# Patient Record
Sex: Female | Born: 1965 | Race: White | Hispanic: No | Marital: Married | Smoking: Former smoker
Health system: Southern US, Community
[De-identification: ages and names within clinical notes are randomized; demographics above are authoritative.]

## PROBLEM LIST (undated history)

## (undated) DIAGNOSIS — H409 Unspecified glaucoma: Secondary | ICD-10-CM

## (undated) DIAGNOSIS — F419 Anxiety disorder, unspecified: Secondary | ICD-10-CM

## (undated) DIAGNOSIS — M1990 Unspecified osteoarthritis, unspecified site: Secondary | ICD-10-CM

## (undated) DIAGNOSIS — E785 Hyperlipidemia, unspecified: Secondary | ICD-10-CM

## (undated) DIAGNOSIS — J302 Other seasonal allergic rhinitis: Secondary | ICD-10-CM

## (undated) DIAGNOSIS — Z87442 Personal history of urinary calculi: Secondary | ICD-10-CM

## (undated) DIAGNOSIS — E559 Vitamin D deficiency, unspecified: Secondary | ICD-10-CM

## (undated) DIAGNOSIS — E039 Hypothyroidism, unspecified: Secondary | ICD-10-CM

## (undated) HISTORY — DX: Vitamin D deficiency, unspecified: E55.9

## (undated) HISTORY — PX: TUBAL LIGATION: SHX77

## (undated) HISTORY — DX: Hyperlipidemia, unspecified: E78.5

## (undated) HISTORY — PX: ABDOMINAL HYSTERECTOMY: SHX81

## (undated) HISTORY — DX: Unspecified osteoarthritis, unspecified site: M19.90

## (undated) HISTORY — DX: Anxiety disorder, unspecified: F41.9

## (undated) HISTORY — DX: Other seasonal allergic rhinitis: J30.2

## (undated) HISTORY — DX: Morbid (severe) obesity due to excess calories: E66.01

## (undated) HISTORY — PX: CYSTOSCOPY: SUR368

---

## 2004-05-21 ENCOUNTER — Ambulatory Visit (HOSPITAL_COMMUNITY): Admission: RE | Admit: 2004-05-21 | Discharge: 2004-05-21 | Payer: Self-pay | Admitting: Family Medicine

## 2004-06-04 ENCOUNTER — Ambulatory Visit (HOSPITAL_COMMUNITY): Admission: RE | Admit: 2004-06-04 | Discharge: 2004-06-04 | Payer: Self-pay | Admitting: Family Medicine

## 2004-06-14 ENCOUNTER — Ambulatory Visit (HOSPITAL_COMMUNITY): Admission: RE | Admit: 2004-06-14 | Discharge: 2004-06-14 | Payer: Self-pay | Admitting: Urology

## 2004-06-17 ENCOUNTER — Ambulatory Visit (HOSPITAL_COMMUNITY): Admission: RE | Admit: 2004-06-17 | Discharge: 2004-06-17 | Payer: Self-pay | Admitting: Urology

## 2010-04-04 ENCOUNTER — Encounter: Payer: Self-pay | Admitting: Urology

## 2013-05-02 ENCOUNTER — Other Ambulatory Visit (HOSPITAL_COMMUNITY): Payer: Self-pay | Admitting: Family Medicine

## 2013-05-02 ENCOUNTER — Ambulatory Visit (HOSPITAL_COMMUNITY)
Admission: RE | Admit: 2013-05-02 | Discharge: 2013-05-02 | Disposition: A | Discharge status: Home / Self Care | Payer: BC Managed Care – PPO | Source: Ambulatory Visit | Attending: Family Medicine | Admitting: Family Medicine

## 2013-05-02 DIAGNOSIS — R109 Unspecified abdominal pain: Secondary | ICD-10-CM

## 2013-05-02 DIAGNOSIS — K8020 Calculus of gallbladder without cholecystitis without obstruction: Secondary | ICD-10-CM | POA: Insufficient documentation

## 2013-05-02 DIAGNOSIS — K7689 Other specified diseases of liver: Secondary | ICD-10-CM | POA: Insufficient documentation

## 2013-05-02 DIAGNOSIS — R1011 Right upper quadrant pain: Secondary | ICD-10-CM

## 2013-05-03 ENCOUNTER — Inpatient Hospital Stay (HOSPITAL_COMMUNITY): Admission: RE | Admit: 2013-05-03 | Payer: Self-pay | Source: Ambulatory Visit

## 2013-05-07 ENCOUNTER — Encounter (HOSPITAL_COMMUNITY): Payer: Self-pay | Admitting: Pharmacy Technician

## 2013-05-07 ENCOUNTER — Encounter (HOSPITAL_COMMUNITY): Payer: Self-pay

## 2013-05-07 ENCOUNTER — Encounter (HOSPITAL_COMMUNITY)
Admission: RE | Admit: 2013-05-07 | Discharge: 2013-05-07 | Disposition: A | Discharge status: Home / Self Care | Payer: BC Managed Care – PPO | Source: Ambulatory Visit | Attending: General Surgery | Admitting: General Surgery

## 2013-05-07 HISTORY — DX: Unspecified glaucoma: H40.9

## 2013-05-07 HISTORY — DX: Personal history of urinary calculi: Z87.442

## 2013-05-07 LAB — BASIC METABOLIC PANEL
BUN: 14 mg/dL (ref 6–23)
CALCIUM: 9.1 mg/dL (ref 8.4–10.5)
CO2: 28 mEq/L (ref 19–32)
CREATININE: 0.65 mg/dL (ref 0.50–1.10)
Chloride: 101 mEq/L (ref 96–112)
GFR calc Af Amer: >90 mL/min (ref >90)
GLUCOSE: 104 mg/dL — AB (ref 70–99)
Potassium: 4.4 mEq/L (ref 3.7–5.3)
Sodium: 141 mEq/L (ref 137–147)

## 2013-05-07 LAB — HEPATIC FUNCTION PANEL
ALK PHOS: 143 U/L — AB (ref 39–117)
ALT: 194 U/L — AB (ref 0–35)
AST: 147 U/L — ABNORMAL HIGH (ref 0–37)
Albumin: 3.3 g/dL — ABNORMAL LOW (ref 3.5–5.2)
BILIRUBIN DIRECT: 0.2 mg/dL (ref 0.0–0.3)
BILIRUBIN TOTAL: 0.5 mg/dL (ref 0.3–1.2)
Indirect Bilirubin: 0.3 mg/dL (ref 0.3–0.9)
Total Protein: 7.8 g/dL (ref 6.0–8.3)

## 2013-05-07 LAB — CBC WITH DIFFERENTIAL/PLATELET
BASOS ABS: 0 10*3/uL (ref 0.0–0.1)
BASOS PCT: 0 % (ref 0–1)
EOS ABS: 0.1 10*3/uL (ref 0.0–0.7)
Eosinophils Relative: 2 % (ref 0–5)
HEMATOCRIT: 39.9 % (ref 36.0–46.0)
HEMOGLOBIN: 13.2 g/dL (ref 12.0–15.0)
Lymphocytes Relative: 18 % (ref 12–46)
Lymphs Abs: 1 10*3/uL (ref 0.7–4.0)
MCH: 30.6 pg (ref 26.0–34.0)
MCHC: 33.1 g/dL (ref 30.0–36.0)
MCV: 92.4 fL (ref 78.0–100.0)
MONO ABS: 0.4 10*3/uL (ref 0.1–1.0)
Monocytes Relative: 8 % (ref 3–12)
NEUTROS ABS: 3.9 10*3/uL (ref 1.7–7.7)
NEUTROS PCT: 72 % (ref 43–77)
Platelets: 337 10*3/uL (ref 150–400)
RBC: 4.32 MIL/uL (ref 3.87–5.11)
RDW: 14.5 % (ref 11.5–15.5)
WBC: 5.5 10*3/uL (ref 4.0–10.5)

## 2013-05-07 NOTE — Patient Instructions (Signed)
Molly DibbleMona B Meinhardt  05/07/2013   Your procedure is scheduled on:   05/10/2013  Report to [facility] at  730  AM.  Call this number if you have problems the morning of surgery: [phone]   Remember:   Do not eat food or drink liquids after midnight.   Take these medicines the morning of surgery with A SIP OF WATER: oxycodone   Do not wear jewelry, make-up or nail polish.  Do not wear lotions, powders, or perfumes.   Do not shave 48 hours prior to surgery. Men may shave face and neck.  Do not bring valuables to the hospital.  [facility] not [facility] any belongings or valuables.               Contacts, dentures or bridgework may not be worn into surgery.  Leave suitcase in the car. After surgery it may be brought to your room.  For patients admitted to the hospital, discharge time is determined by your treatment team.               Patients discharged the day of surgery will not be allowed to drive home.  Name and phone number of your driver: family  Special Instructions: Shower using CHG 2 nights before surgery and the night before surgery.  If you shower the day of surgery use CHG.  Use special wash - you have one bottle of CHG for all showers.  You should use approximately 1/3 of the bottle for each shower.   Please read over the following fact sheets that you were given: Pain Booklet, Coughing and Deep Breathing, Surgical Site Infection Prevention, Anesthesia Post-op Instructions and Care and Recovery After Surgery Laparoscopic Cholecystectomy Laparoscopic cholecystectomy is surgery to remove the gallbladder. The gallbladder is located in the upper right part of the abdomen, behind the liver. It is a storage sac for bile produced in the liver. Bile aids in the digestion and absorption of fats. Cholecystectomy is often done for inflammation of the gallbladder (cholecystitis). This condition is usually caused by a buildup of gallstones (cholelithiasis) in your gallbladder.  Gallstones can block the flow of bile, resulting in inflammation and pain. In severe cases, emergency surgery may be required. When emergency surgery is not required, you will have time to prepare for the procedure. Laparoscopic surgery is an alternative to open surgery. Laparoscopic surgery has a shorter recovery time. Your common bile duct may also need to be examined during the procedure. If stones are found in the common bile duct, they may be removed. LET [facility] KNOW ABOUT:  Any allergies you have.  All medicines you are taking, including vitamins, herbs, eye drops, creams, and over-the-counter medicines.  Previous problems you or members of your family have had with the use of anesthetics.  Any blood disorders you have.  Previous surgeries you have had.  Medical conditions you have. RISKS AND COMPLICATIONS Generally, this is a safe procedure. However, as with any procedure, complications can occur. Possible complications include:  Infection.  Damage to the common bile duct, nerves, arteries, veins, or other internal organs such as the stomach, liver, or intestines.  Bleeding.  A stone may remain in the common bile duct.  A bile leak from the cyst duct that is clipped when your gallbladder is removed.  The need to convert to open surgery, which requires a larger incision in the abdomen. This may be necessary if your surgeon thinks it is  not safe to continue with a laparoscopic procedure. BEFORE THE PROCEDURE  Ask your health care provider about changing or stopping any regular medicines. You will need to stop taking aspirin or blood thinners at least 5 days prior to surgery.  Do not eat or drink anything after midnight the night before surgery.  Let your health care provider know if you develop a cold or other infectious problem before surgery. PROCEDURE   You will be given medicine to make you sleep through the procedure (general anesthetic). A breathing  tube will be placed in your mouth.  When you are asleep, your surgeon will make several small cuts (incisions) in your abdomen.  A thin, lighted tube with a tiny camera on the end (laparoscope) is inserted through one of the small incisions. The camera on the laparoscope sends a picture to a TV screen in the operating room. This gives the surgeon a good view inside your abdomen.  A gas will be pumped into your abdomen. This expands your abdomen so that the surgeon has more room to perform the surgery.  Other tools needed for the procedure are inserted through the other incisions. The gallbladder is removed through one of the incisions.  After the removal of your gallbladder, the incisions will be closed with stitches, staples, or skin glue. AFTER THE PROCEDURE  You will be taken to a recovery area where your progress will be checked often.  You may be allowed to go home the same day if your pain is controlled and you can tolerate liquids. Document Released: 02/28/2005 Document Revised: 12/19/2012 Document Reviewed: 10/10/2012 [facility] Patient Information 2014 [place]. PATIENT INSTRUCTIONS POST-ANESTHESIA  IMMEDIATELY FOLLOWING SURGERY:  Do not drive or operate machinery for the first twenty four hours after surgery.  Do not make any important decisions for twenty four hours after surgery or while taking narcotic pain medications or sedatives.  If you develop intractable nausea and vomiting or a severe headache please notify your doctor immediately.  FOLLOW-UP:  Please make an appointment with your surgeon as instructed. You do not need to follow up with anesthesia unless specifically instructed to do so.  WOUND CARE INSTRUCTIONS (if applicable):  Keep a dry clean dressing on the anesthesia/puncture wound site if there is drainage.  Once the wound has quit draining you may leave it open to air.  Generally you should leave the bandage intact for twenty four hours unless there is  drainage.  If the epidural site drains for more than 36-48 hours please call the anesthesia department.  QUESTIONS?:  Please feel free to call your physician or the hospital operator if you have any questions, and they will be happy to assist you.

## 2013-05-07 NOTE — H&P (Signed)
  NTS SOAP Note  Vital Signs:  Vitals as of: 05/07/2013: Systolic 137: Diastolic 90: Heart Rate 98: Temp 96.91F: Height 255ft 4in: Weight 275Lbs 0 Ounces: BMI 47.2  BMI : 47.2 kg/m2  Subjective: This 48 Years 559 Months old Female presents for of abdominal pain.  Has been having intermittent right upper quadrant abdominal pain, radiating to the right flank, nausea, and fatty food intolerance.  No fever, chills, jaundice.  U/S of gallbladder reveals choleltihaisis, normal common bile duct.  Review of Symptoms:  Constitutional:unremarkable   Head:unremarkable    Eyes:unremarkable   Nose/Mouth/Throat:unremarkable Cardiovascular:  unremarkable   Respiratory:unremarkable       see above Genitourinary:unremarkable     Musculoskeletal:unremarkable   Skin:unremarkable Breast:   Hematolgic/Lymphatic:unremarkable     Allergic/Immunologic:unremarkable     Past Medical History:    Reviewed  Past Medical History  Surgical History: SURGICAL HISTORY... Hysterectomy Medical Problems: glaucoma Allergies: sulfur, codeine (tolerates percocet) Medications: lumigan   Social History:Reviewed  Social History  Preferred Language: English Race:  White Ethnicity: Not Hispanic / Latino Age: 48 Years 9 Months Marital Status:  M Alcohol: no Recreational drug(s): no   Smoking Status: Never smoker reviewed on 05/07/2013 Functional Status reviewed on 05/07/2013 ------------------------------------------------ Bathing: Normal Cooking: Normal Dressing: Normal Driving: Normal Eating: Normal Managing Meds: Normal Oral Care: Normal Shopping: Normal Toileting: Normal Transferring: Normal Walking: Normal Cognitive Status reviewed on 05/07/2013 ------------------------------------------------ Attention: Normal Decision Making: Normal Language: Normal Memory: Normal Motor: Normal Perception: Normal Problem Solving: Normal Visual and Spatial:  Normal   Family History:  Reviewed  Family Health History Mother, Living; Healthy; healthy Father, Living; Healthy; healthy    Objective Information: General:  Well appearing, well nourished in no distress.   no scleral icterus Heart:  RRR, no murmur or gallop.  Normal S1, S2.  No S3, S4.  Lungs:    CTA bilaterally, no wheezes, rhonchi, rales.  Breathing unlabored. Abdomen:Soft, minimal tenderness in right upper quadrant to patpation, ND, normal bowel sounds, no HSM, no masses.  No peritoneal signs.  Assessment:Cholelithiasis  Diagnoses: 574.20 Gallstone (Calculus of gallbladder without cholecystitis without obstruction)  Procedures: [id number] - OFFICE OUTPATIENT NEW 30 MINUTES    Plan:  Scheduled for laparoscopic cholecystectomy on 05/10/13.   Patient Education:Alternative treatments to surgery were discussed with patient (and family).  Risks and benefits  of procedure including bleeding, infection, hepatobiliary injury, and the possibility of an open procedure were fully explained to the patient (and family) who gave informed consent. Patient/family questions were addressed.  Follow-up:Pending Surgery

## 2013-05-07 NOTE — Pre-Procedure Instructions (Signed)
Dr Lovell SheehanJenkins office called and he is aware of elevated LFT's.

## 2013-05-08 ENCOUNTER — Encounter (HOSPITAL_COMMUNITY): Payer: Self-pay | Admitting: Pharmacy Technician

## 2013-05-10 ENCOUNTER — Encounter (HOSPITAL_COMMUNITY): Payer: BC Managed Care – PPO | Admitting: Anesthesiology

## 2013-05-10 ENCOUNTER — Encounter (HOSPITAL_COMMUNITY): Payer: Self-pay | Admitting: *Deleted

## 2013-05-10 ENCOUNTER — Ambulatory Visit (HOSPITAL_COMMUNITY): Payer: BC Managed Care – PPO | Admitting: Anesthesiology

## 2013-05-10 ENCOUNTER — Encounter (HOSPITAL_COMMUNITY)
Admission: RE | Disposition: A | Discharge status: Home / Self Care | Payer: Self-pay | Source: Ambulatory Visit | Attending: General Surgery

## 2013-05-10 ENCOUNTER — Ambulatory Visit (HOSPITAL_COMMUNITY)
Admission: RE | Admit: 2013-05-10 | Discharge: 2013-05-10 | Disposition: A | Discharge status: Home / Self Care | Payer: BC Managed Care – PPO | Source: Ambulatory Visit | Attending: General Surgery | Admitting: General Surgery

## 2013-05-10 DIAGNOSIS — Z6841 Body Mass Index (BMI) 40.0 and over, adult: Secondary | ICD-10-CM | POA: Insufficient documentation

## 2013-05-10 DIAGNOSIS — Z87891 Personal history of nicotine dependence: Secondary | ICD-10-CM | POA: Insufficient documentation

## 2013-05-10 DIAGNOSIS — K219 Gastro-esophageal reflux disease without esophagitis: Secondary | ICD-10-CM | POA: Insufficient documentation

## 2013-05-10 DIAGNOSIS — K821 Hydrops of gallbladder: Secondary | ICD-10-CM | POA: Insufficient documentation

## 2013-05-10 DIAGNOSIS — K8010 Calculus of gallbladder with chronic cholecystitis without obstruction: Secondary | ICD-10-CM | POA: Insufficient documentation

## 2013-05-10 HISTORY — PX: CHOLECYSTECTOMY: SHX55

## 2013-05-10 SURGERY — LAPAROSCOPIC CHOLECYSTECTOMY
Anesthesia: General | Site: Abdomen

## 2013-05-10 MED ORDER — SUCCINYLCHOLINE CHLORIDE 20 MG/ML IJ SOLN
INTRAMUSCULAR | Status: AC
  Filled 2013-05-10: qty 1

## 2013-05-10 MED ORDER — FENTANYL CITRATE 0.05 MG/ML IJ SOLN
INTRAMUSCULAR | Status: DC | PRN
  Administered 2013-05-10: 50 ug via INTRAVENOUS
  Administered 2013-05-10: 100 ug via INTRAVENOUS
  Administered 2013-05-10 (×4): 50 ug via INTRAVENOUS

## 2013-05-10 MED ORDER — POVIDONE-IODINE 10 % OINT PACKET
TOPICAL_OINTMENT | CUTANEOUS | Status: DC | PRN
  Administered 2013-05-10: 1 via TOPICAL

## 2013-05-10 MED ORDER — OXYCODONE-ACETAMINOPHEN 7.5-325 MG PO TABS: 1.0000 | ORAL_TABLET | ORAL | Status: DC | PRN

## 2013-05-10 MED ORDER — ENOXAPARIN SODIUM 40 MG/0.4ML ~~LOC~~ SOLN
40.0000 mg | Freq: Once | SUBCUTANEOUS | Status: AC
Start: 2013-05-10 — End: 2013-05-10
  Administered 2013-05-10: 40 mg via SUBCUTANEOUS
  Filled 2013-05-10: qty 0.4

## 2013-05-10 MED ORDER — LIDOCAINE HCL 1 % IJ SOLN
INTRAMUSCULAR | Status: DC | PRN
  Administered 2013-05-10: 30 mg via INTRADERMAL

## 2013-05-10 MED ORDER — POVIDONE-IODINE 10 % EX OINT
TOPICAL_OINTMENT | CUTANEOUS | Status: AC
  Filled 2013-05-10: qty 1

## 2013-05-10 MED ORDER — LIDOCAINE HCL (PF) 1 % IJ SOLN
INTRAMUSCULAR | Status: AC
  Filled 2013-05-10: qty 5

## 2013-05-10 MED ORDER — PROPOFOL 10 MG/ML IV EMUL
INTRAVENOUS | Status: AC
  Filled 2013-05-10: qty 20

## 2013-05-10 MED ORDER — GLYCOPYRROLATE 0.2 MG/ML IJ SOLN
INTRAMUSCULAR | Status: DC | PRN
  Administered 2013-05-10: .8 mg via INTRAVENOUS

## 2013-05-10 MED ORDER — NEOSTIGMINE METHYLSULFATE 1 MG/ML IJ SOLN
INTRAMUSCULAR | Status: DC | PRN
  Administered 2013-05-10: 5 mg via INTRAVENOUS

## 2013-05-10 MED ORDER — ROCURONIUM BROMIDE 50 MG/5ML IV SOLN
INTRAVENOUS | Status: AC
  Filled 2013-05-10: qty 1

## 2013-05-10 MED ORDER — ONDANSETRON HCL 4 MG/2ML IJ SOLN
4.0000 mg | Freq: Once | INTRAMUSCULAR | Status: AC
  Administered 2013-05-10: 4 mg via INTRAVENOUS
  Filled 2013-05-10: qty 2

## 2013-05-10 MED ORDER — SODIUM CHLORIDE 0.9 % IR SOLN
Status: DC | PRN
  Administered 2013-05-10: 1000 mL

## 2013-05-10 MED ORDER — FENTANYL CITRATE 0.05 MG/ML IJ SOLN
INTRAMUSCULAR | Status: AC
  Filled 2013-05-10: qty 2

## 2013-05-10 MED ORDER — PROPOFOL 10 MG/ML IV BOLUS
INTRAVENOUS | Status: DC | PRN
  Administered 2013-05-10: 150 mg via INTRAVENOUS

## 2013-05-10 MED ORDER — ROCURONIUM BROMIDE 100 MG/10ML IV SOLN
INTRAVENOUS | Status: DC | PRN
  Administered 2013-05-10: 5 mg via INTRAVENOUS
  Administered 2013-05-10: 25 mg via INTRAVENOUS

## 2013-05-10 MED ORDER — FENTANYL CITRATE 0.05 MG/ML IJ SOLN
25.0000 ug | INTRAMUSCULAR | Status: DC | PRN
  Administered 2013-05-10 (×2): 50 ug via INTRAVENOUS

## 2013-05-10 MED ORDER — KETOROLAC TROMETHAMINE 30 MG/ML IJ SOLN
30.0000 mg | Freq: Once | INTRAMUSCULAR | Status: AC
  Administered 2013-05-10: 30 mg via INTRAVENOUS
  Filled 2013-05-10: qty 1

## 2013-05-10 MED ORDER — HEMOSTATIC AGENTS (NO CHARGE) OPTIME
TOPICAL | Status: DC | PRN
Start: 2013-05-10 — End: 2013-05-10
  Administered 2013-05-10: 1 via TOPICAL

## 2013-05-10 MED ORDER — ONDANSETRON HCL 4 MG/2ML IJ SOLN
4.0000 mg | Freq: Once | INTRAMUSCULAR | Status: AC | PRN
  Administered 2013-05-10: 4 mg via INTRAVENOUS

## 2013-05-10 MED ORDER — ONDANSETRON HCL 4 MG/2ML IJ SOLN
INTRAMUSCULAR | Status: AC
  Filled 2013-05-10: qty 2

## 2013-05-10 MED ORDER — CLINDAMYCIN PHOSPHATE 900 MG/50ML IV SOLN
900.0000 mg | Freq: Once | INTRAVENOUS | Status: AC
  Administered 2013-05-10: 900 mg via INTRAVENOUS
  Filled 2013-05-10: qty 50

## 2013-05-10 MED ORDER — LACTATED RINGERS IV SOLN
INTRAVENOUS | Status: DC
  Administered 2013-05-10 (×2): via INTRAVENOUS

## 2013-05-10 MED ORDER — BUPIVACAINE HCL (PF) 0.5 % IJ SOLN
INTRAMUSCULAR | Status: AC
  Filled 2013-05-10: qty 30

## 2013-05-10 MED ORDER — CHLORHEXIDINE GLUCONATE 4 % EX LIQD: 1.0000 "application " | Freq: Once | CUTANEOUS | Status: DC

## 2013-05-10 MED ORDER — GLYCOPYRROLATE 0.2 MG/ML IJ SOLN
0.2000 mg | Freq: Once | INTRAMUSCULAR | Status: AC
  Administered 2013-05-10: 0.2 mg via INTRAVENOUS
  Filled 2013-05-10: qty 1

## 2013-05-10 MED ORDER — BUPIVACAINE HCL (PF) 0.5 % IJ SOLN
INTRAMUSCULAR | Status: DC | PRN
  Administered 2013-05-10: 10 mL

## 2013-05-10 MED ORDER — FENTANYL CITRATE 0.05 MG/ML IJ SOLN
INTRAMUSCULAR | Status: AC
  Filled 2013-05-10: qty 5

## 2013-05-10 MED ORDER — SUCCINYLCHOLINE CHLORIDE 20 MG/ML IJ SOLN
INTRAMUSCULAR | Status: DC | PRN
  Administered 2013-05-10: 120 mg via INTRAVENOUS

## 2013-05-10 MED ORDER — MIDAZOLAM HCL 2 MG/2ML IJ SOLN
1.0000 mg | INTRAMUSCULAR | Status: DC | PRN
  Administered 2013-05-10: 2 mg via INTRAVENOUS
  Filled 2013-05-10: qty 2

## 2013-05-10 SURGICAL SUPPLY — 46 items
APPLIER CLIP LAPSCP 10X32 DD (CLIP) ×2 IMPLANT
BAG HAMPER (MISCELLANEOUS) ×2 IMPLANT
CLOTH BEACON ORANGE TIMEOUT ST (SAFETY) ×2 IMPLANT
COVER LIGHT HANDLE STERIS (MISCELLANEOUS) ×4 IMPLANT
DECANTER SPIKE VIAL GLASS SM (MISCELLANEOUS) ×2 IMPLANT
DRAPE PROXIMA HALF (DRAPES) ×2 IMPLANT
DURAPREP 26ML APPLICATOR (WOUND CARE) ×2 IMPLANT
ELECT REM PT RETURN 9FT ADLT (ELECTROSURGICAL) ×2
ELECTRODE REM PT RTRN 9FT ADLT (ELECTROSURGICAL) ×1 IMPLANT
FILTER SMOKE EVAC LAPAROSHD (FILTER) ×2 IMPLANT
FORMALIN 10 PREFIL 120ML (MISCELLANEOUS) ×2 IMPLANT
GLOVE BIO SURGEON STRL SZ7.5 (GLOVE) ×2 IMPLANT
GLOVE BIOGEL PI IND STRL 7.0 (GLOVE) ×2 IMPLANT
GLOVE BIOGEL PI IND STRL 8 (GLOVE) ×1 IMPLANT
GLOVE BIOGEL PI IND STRL 8.5 (GLOVE) ×2 IMPLANT
GLOVE BIOGEL PI INDICATOR 7.0 (GLOVE) ×2
GLOVE BIOGEL PI INDICATOR 8 (GLOVE) ×1
GLOVE BIOGEL PI INDICATOR 8.5 (GLOVE) ×2
GLOVE ECLIPSE 7.0 STRL STRAW (GLOVE) ×4 IMPLANT
GLOVE ECLIPSE 8.5 STRL (GLOVE) ×2 IMPLANT
GLOVE EXAM NITRILE LRG STRL (GLOVE) ×2 IMPLANT
GLOVE SS BIOGEL STRL SZ 6.5 (GLOVE) ×1 IMPLANT
GLOVE SUPERSENSE BIOGEL SZ 6.5 (GLOVE) ×1
GOWN STRL REUS W/TWL LRG LVL3 (GOWN DISPOSABLE) ×6 IMPLANT
HEMOSTAT SNOW SURGICEL 2X4 (HEMOSTASIS) ×2 IMPLANT
INST SET LAPROSCOPIC AP (KITS) ×2 IMPLANT
KIT ROOM TURNOVER APOR (KITS) ×2 IMPLANT
MANIFOLD NEPTUNE II (INSTRUMENTS) ×2 IMPLANT
NEEDLE INSUFFLATION 14GA 120MM (NEEDLE) ×2 IMPLANT
NS IRRIG 1000ML POUR BTL (IV SOLUTION) ×2 IMPLANT
PACK LAP CHOLE LZT030E (CUSTOM PROCEDURE TRAY) ×2 IMPLANT
PAD ARMBOARD 7.5X6 YLW CONV (MISCELLANEOUS) ×2 IMPLANT
PENCIL HANDSWITCHING (ELECTRODE) ×2 IMPLANT
POUCH SPECIMEN RETRIEVAL 10MM (ENDOMECHANICALS) ×2 IMPLANT
SET BASIN LINEN APH (SET/KITS/TRAYS/PACK) ×2 IMPLANT
SLEEVE ENDOPATH XCEL 5M (ENDOMECHANICALS) ×2 IMPLANT
SPONGE GAUZE 2X2 8PLY STRL LF (GAUZE/BANDAGES/DRESSINGS) ×8 IMPLANT
STAPLER VISISTAT (STAPLE) ×2 IMPLANT
SUT VICRYL 0 UR6 27IN ABS (SUTURE) ×4 IMPLANT
TAPE CLOTH SURG 4X10 WHT LF (GAUZE/BANDAGES/DRESSINGS) ×2 IMPLANT
TROCAR ENDO BLADELESS 11MM (ENDOMECHANICALS) ×2 IMPLANT
TROCAR XCEL NON-BLD 5MMX100MML (ENDOMECHANICALS) ×2 IMPLANT
TROCAR XCEL UNIV SLVE 11M 100M (ENDOMECHANICALS) ×2 IMPLANT
TUBING INSUFFLATION (TUBING) ×2 IMPLANT
WARMER LAPAROSCOPE (MISCELLANEOUS) ×2 IMPLANT
YANKAUER SUCT 12FT TUBE ARGYLE (SUCTIONS) ×2 IMPLANT

## 2013-05-10 NOTE — Anesthesia Preprocedure Evaluation (Signed)
Anesthesia Evaluation  Patient identified by MRN, date of birth, ID band Patient awake    Reviewed: Allergy & Precautions, H&P , NPO status , Patient's Chart, lab work & pertinent test results  Airway Mallampati: II TM Distance: >3 FB Neck ROM: Full    Dental  (+) Teeth Intact   Pulmonary neg pulmonary ROS, former smoker,  breath sounds clear to auscultation        Cardiovascular negative cardio ROS  Rhythm:Regular Rate:Normal     Neuro/Psych    GI/Hepatic GERD-  Medicated and Controlled,  Endo/Other  Morbid obesity  Renal/GU      Musculoskeletal   Abdominal   Peds  Hematology   Anesthesia Other Findings   Reproductive/Obstetrics                           Anesthesia Physical Anesthesia Plan  ASA: II  Anesthesia Plan: General   Post-op Pain Management:    Induction: Intravenous, Rapid sequence and Cricoid pressure planned  Airway Management Planned: Oral ETT  Additional Equipment:   Intra-op Plan:   Post-operative Plan: Extubation in OR  Informed Consent: I have reviewed the patients History and Physical, chart, labs and discussed the procedure including the risks, benefits and alternatives for the proposed anesthesia with the patient or authorized representative who has indicated his/her understanding and acceptance.     Plan Discussed with:   Anesthesia Plan Comments:         Anesthesia Quick Evaluation

## 2013-05-10 NOTE — Discharge Instructions (Signed)
Laparoscopic Cholecystectomy, Care After °Refer to this sheet in the next few weeks. These instructions provide you with information on caring for yourself after your procedure. Your health care provider may also give you more specific instructions. Your treatment has been planned according to current medical practices, but problems sometimes occur. Call your health care provider if you have any problems or questions after your procedure. °WHAT TO EXPECT AFTER THE PROCEDURE °After your procedure, it is typical to have the following: °· Pain at your incision sites. You will be given pain medicines to control the pain. °· Mild nausea or vomiting. This should improve after the first 24 hours. °· Bloating and possibly shoulder pain from the gas used during the procedure. This will improve after the first 24 hours. °HOME CARE INSTRUCTIONS  °· Change bandages (dressings) as directed by your health care provider. °· Keep the wound dry and clean. You may wash the wound gently with soap and water. Gently blot or dab the area dry. °· Do not take baths or use swimming pools or hot tubs for 2 weeks or until your health care provider approves. °· Only take over-the-counter or prescription medicines as directed by your health care provider. °· Continue your normal diet as directed by your health care provider. °· Do not lift anything heavier than 10 pounds (4.5 kg) until your health care provider approves. °· Do not play contact sports for 1 week or until your health care provider approves. °SEEK MEDICAL CARE IF:  °· You have redness, swelling, or increasing pain in the wound. °· You notice yellowish-white fluid (pus) coming from the wound. °· You have drainage from the wound that lasts longer than 1 day. °· You notice a bad smell coming from the wound or dressing. °· Your surgical cuts (incisions) break open. °SEEK IMMEDIATE MEDICAL CARE IF:  °· You develop a rash. °· You have difficulty breathing. °· You have chest pain. °· You  have a fever. °· You have increasing pain in the shoulders (shoulder strap areas). °· You have dizzy episodes or faint while standing. °· You have severe abdominal pain. °· You feel sick to your stomach (nauseous) or throw up (vomit) and this lasts for more than 1 day. °Document Released: 02/28/2005 Document Revised: 12/19/2012 Document Reviewed: 10/10/2012 °[employer]® Patient Information ©2014 [employer]. ° °

## 2013-05-10 NOTE — Op Note (Signed)
Patient:  Molly Hamilton  DOB:  09/25/1965  MRN:  [id number]   Preop Diagnosis:  Cholecystitis, cholelithiasis  Postop Diagnosis:  Same, hydrops of gallbladder  Procedure:  Laparoscopic cholecystectomy  Surgeon:  Franky MachoMark Kalsey Lull, M.D.  Anes:  General endotracheal  Indications:  Patient is a 48 year old white female presents with cholecystitis secondary to cholelithiasis. The risks and benefits of the procedure including bleeding, infection, hepatobiliary injury, and the possibility of an open procedure were fully explained to the patient, who gave informed consent.  Procedure note:  The patient is placed the supine position. After induction of general endotracheal anesthesia, the abdomen was prepped and draped using usual sterile technique with DuraPrep. Surgical site confirmation was performed.  A supraumbilical incision was made down to the fascia. A Veress needle was introduced into the abdominal cavity and confirmation of placement was done using the saline drop test. The abdomen was then insufflated to 16 mm mercury pressure. An 11 mm trocar was introduced into the abdominal cavity under direct visualization without difficulty. The patient is placed in reverse Trendelenburg position and additional 11 mm trocar was placed the epigastric region and 5 mm trochars were placed the right upper quadrant and right flank regions. The liver was inspected and noted to be within normal limits. The gallbladder was noted to be significantly distended with a thickened gallbladder wall. An incision was made into the anterior aspect of the gallbladder and hydrops of the gallbladder was found. A large stone was impacted in the infundibulum of the gallbladder. The luminal contents were aspirated with suction. The gallbladder was then retracted in a dynamic fashion in order to expose the triangle of Calot. The cystic duct was first identified. Its juncture to the infundibulum was fully identified. Endoclips were placed  proximally and distally on the cystic duct, and the cystic duct was divided. This was likewise done to the cystic artery. The gallbladder was then freed away from the gallbladder fossa using Bovie electrocautery. The gallbladder was delivered through the epigastric trocar site using an Endo Catch bag. The gallbladder fossa was inspected and no abnormal bleeding or bile leakage was noted. Surgicel was placed the gallbladder fossa. All fluid and air were then evacuated from the abdominal cavity prior to removal of the trochars.  All wounds were irrigated with normal saline. All wounds were checked with 0.5% Sensorcaine. The supraumbilical fascia as well as epigastric fascia were reapproximated using 0 Vicryl interrupted sutures. All skin incisions were closed using staples. Betadine ointment and dry sterile dressings were applied.  All tape and needle counts were correct at the end of the procedure. Patient was extubated in the operating room and transferred to PACU in stable condition.    Complications:  None  EBL:  Minimal  Specimen:  Gallbladder

## 2013-05-10 NOTE — Anesthesia Procedure Notes (Signed)
Procedure Name: Intubation Date/Time: 05/10/2013 9:11 AM Performed by: Glynn OctaveANIEL, Adaora Mchaney E Pre-anesthesia Checklist: Patient identified, Patient being monitored, Timeout performed, Emergency Drugs available and Suction available Patient Re-evaluated:Patient Re-evaluated prior to inductionOxygen Delivery Method: Circle System Utilized Preoxygenation: Pre-oxygenation with 100% oxygen Intubation Type: IV induction, Rapid sequence and Cricoid Pressure applied Ventilation: Mask ventilation without difficulty Laryngoscope Size: Mac and 3 Grade View: Grade I Tube type: Oral Tube size: 7.0 mm Number of attempts: 1 Airway Equipment and Method: stylet Placement Confirmation: ETT inserted through vocal cords under direct vision,  positive ETCO2 and breath sounds checked- equal and bilateral Secured at: 21 cm Tube secured with: Tape Dental Injury: Teeth and Oropharynx as per pre-operative assessment

## 2013-05-10 NOTE — Interval H&P Note (Signed)
History and Physical Interval Note:  05/10/2013 7:29 AM  Molly Hamilton  has presented today for surgery, with the diagnosis of cholelithiasis  The various methods of treatment have been discussed with the patient and family. After consideration of risks, benefits and other options for treatment, the patient has consented to  Procedure(s): LAPAROSCOPIC CHOLECYSTECTOMY (N/A) as a surgical intervention .  The patient's history has been reviewed, patient examined, no change in status, stable for surgery.  I have reviewed the patient's chart and labs.  Questions were answered to the patient's satisfaction.     Franky MachoJENKINS,Merdis Snodgrass A

## 2013-05-10 NOTE — Anesthesia Postprocedure Evaluation (Signed)
  Anesthesia Post-op Note  Patient: Molly Hamilton  Procedure(s) Performed: Procedure(s): LAPAROSCOPIC CHOLECYSTECTOMY (N/A)  Patient Location: PACU  Anesthesia Type:General  Level of Consciousness: awake, alert  and oriented  Airway and Oxygen Therapy: Patient Spontanous Breathing and Patient connected to face mask oxygen  Post-op Pain: mild  Post-op Assessment: Post-op Vital signs reviewed, Patient's Cardiovascular Status Stable, Respiratory Function Stable and Patent Airway  Post-op Vital Signs: Reviewed and stable  Complications: No apparent anesthesia complications

## 2013-05-10 NOTE — Transfer of Care (Signed)
Immediate Anesthesia Transfer of Care Note  Patient: Molly DibbleMona B Hamilton  Procedure(s) Performed: Procedure(s): LAPAROSCOPIC CHOLECYSTECTOMY (N/A)  Patient Location: PACU  Anesthesia Type:General  Level of Consciousness: awake, alert  and oriented  Airway & Oxygen Therapy: Patient Spontanous Breathing and Patient connected to face mask oxygen  Post-op Assessment: Report given to PACU RN and Post -op Vital signs reviewed and stable  Post vital signs: Reviewed and stable  Complications: No apparent anesthesia complications

## 2013-05-13 ENCOUNTER — Encounter (HOSPITAL_COMMUNITY): Payer: Self-pay | Admitting: General Surgery

## 2014-04-12 ENCOUNTER — Emergency Department (HOSPITAL_COMMUNITY)
Admission: EM | Admit: 2014-04-12 | Discharge: 2014-04-12 | Disposition: A | Discharge status: Home / Self Care | Payer: BLUE CROSS/BLUE SHIELD | Attending: Emergency Medicine | Admitting: Emergency Medicine

## 2014-04-12 ENCOUNTER — Emergency Department (HOSPITAL_COMMUNITY): Payer: BLUE CROSS/BLUE SHIELD

## 2014-04-12 ENCOUNTER — Encounter (HOSPITAL_COMMUNITY): Payer: Self-pay | Admitting: Emergency Medicine

## 2014-04-12 DIAGNOSIS — Z87891 Personal history of nicotine dependence: Secondary | ICD-10-CM | POA: Insufficient documentation

## 2014-04-12 DIAGNOSIS — Z79899 Other long term (current) drug therapy: Secondary | ICD-10-CM | POA: Diagnosis not present

## 2014-04-12 DIAGNOSIS — X58XXXA Exposure to other specified factors, initial encounter: Secondary | ICD-10-CM | POA: Insufficient documentation

## 2014-04-12 DIAGNOSIS — Y9301 Activity, walking, marching and hiking: Secondary | ICD-10-CM | POA: Diagnosis not present

## 2014-04-12 DIAGNOSIS — Z87442 Personal history of urinary calculi: Secondary | ICD-10-CM | POA: Diagnosis not present

## 2014-04-12 DIAGNOSIS — S8262XA Displaced fracture of lateral malleolus of left fibula, initial encounter for closed fracture: Secondary | ICD-10-CM | POA: Diagnosis not present

## 2014-04-12 DIAGNOSIS — Y9289 Other specified places as the place of occurrence of the external cause: Secondary | ICD-10-CM | POA: Insufficient documentation

## 2014-04-12 DIAGNOSIS — S82892A Other fracture of left lower leg, initial encounter for closed fracture: Secondary | ICD-10-CM

## 2014-04-12 DIAGNOSIS — Y998 Other external cause status: Secondary | ICD-10-CM | POA: Insufficient documentation

## 2014-04-12 DIAGNOSIS — S99912A Unspecified injury of left ankle, initial encounter: Secondary | ICD-10-CM | POA: Diagnosis present

## 2014-04-12 DIAGNOSIS — H409 Unspecified glaucoma: Secondary | ICD-10-CM | POA: Diagnosis not present

## 2014-04-12 MED ORDER — OXYCODONE-ACETAMINOPHEN 5-325 MG PO TABS: 1.0000 | ORAL_TABLET | ORAL | Status: DC | PRN

## 2014-04-12 MED ORDER — ONDANSETRON HCL 4 MG/2ML IJ SOLN
4.0000 mg | Freq: Once | INTRAMUSCULAR | Status: AC
  Administered 2014-04-12: 4 mg via INTRAVENOUS
  Filled 2014-04-12: qty 2

## 2014-04-12 MED ORDER — HYDROMORPHONE HCL 1 MG/ML IJ SOLN
1.0000 mg | Freq: Once | INTRAMUSCULAR | Status: AC
  Administered 2014-04-12: 1 mg via INTRAVENOUS
  Filled 2014-04-12: qty 1

## 2014-04-12 NOTE — ED Notes (Signed)
Patient transported to X-ray 

## 2014-04-12 NOTE — ED Notes (Signed)
Ortho at bedside states went to sit pt up for Crutches, Pt states started to feel lightheaded. . PT now complains of pain to Rt ankle. MD made aware. PT now has brusing to rt ankle.

## 2014-04-12 NOTE — ED Provider Notes (Addendum)
CSN: [id number]     Arrival date & time 04/12/14  1348 History   First MD Initiated Contact with Patient 04/12/14 1351     Chief Complaint  Patient presents with  . Ankle Injury     Patient is a 49 y.o. female presenting with lower extremity injury. The history is provided by the patient. No language interpreter was used.  Ankle Injury   Molly Hamilton presents for evaluation of left ankle pain. She was stepping off a curb and felt a pop. She has pain in her left central and lateral ankle. She has no history of prior injuries to this area. She has no head injury or loss of consciousness. She takes no blood thinners. She has glaucoma but no other medical problems. Symptoms are moderate and constant.  Past Medical History  Diagnosis Date  . Glaucoma   . History of kidney stones    Past Surgical History  Procedure Laterality Date  . Abdominal hysterectomy    . Cesarean section      x2  . Tubal ligation    . Cystoscopy      with stone extraction  . Cholecystectomy N/A 05/10/2013    Procedure: LAPAROSCOPIC CHOLECYSTECTOMY;  Surgeon: Dalia Heading, MD;  Location: [facility];  Service: General;  Laterality: N/A;   No family history on file. History  Substance Use Topics  . Smoking status: Former Smoker -- 0.25 packs/day for 4 years    Types: Cigarettes    Quit date: 05/07/1981  . Smokeless tobacco: Not on file  . Alcohol Use: No   OB History    No data available     Review of Systems  All other systems reviewed and are negative.     Allergies  Codeine and Sulfa antibiotics  Home Medications   Prior to Admission medications   Medication Sig Start Date End Date Taking? Authorizing Provider  bimatoprost (LUMIGAN) 0.03 % ophthalmic solution Place 1 drop into both eyes at bedtime.    Historical Provider, MD  latanoprost (XALATAN) 0.005 % ophthalmic solution Place 1 drop into both eyes at bedtime. 04/02/14   Historical Provider, MD  oxyCODONE-acetaminophen (PERCOCET) 7.5-325 MG per  tablet Take 1-2 tablets by mouth every 4 (four) hours as needed. 05/10/13   Dalia Heading, MD   BP 136/58 mmHg  Pulse 74  Temp(Src) 97.4 F (36.3 C) (Oral)  Resp 22  Ht  (1.626 m)  Wt 230 lb (104.327 kg)  BMI 39.46 kg/m2  SpO2 100% Physical Exam  Constitutional: She is oriented to person, place, and time. She appears well-developed and well-nourished.  HENT:  Head: Normocephalic and atraumatic.  Cardiovascular: Normal rate and regular rhythm.   No murmur heard. Pulmonary/Chest: Effort normal and breath sounds normal. No respiratory distress.  Abdominal: Soft. There is no tenderness. There is no rebound and no guarding.  Musculoskeletal:  2+ DP pulses in BLE.  Swelling and ecchymosis to left lateral and anterior ankle.    Neurological: She is alert and oriented to person, place, and time.  Skin: Skin is warm and dry.  Psychiatric: She has a normal mood and affect. Her behavior is normal.  Nursing note and vitals reviewed.   ED Course  Procedures (including critical care time)  SPLINT APPLICATION Date/Time: 4:27 PM Authorized by: Molly Fossa Consent: Verbal consent obtained. Risks and benefits: risks, benefits and alternatives were discussed Consent given by: patient Splint applied by: orthopedic technician Location details: LLE Splint type: Posterior stirrup Post-procedure: The splinted  body part was neurovascularly unchanged following the procedure. Patient tolerance: Patient tolerated the procedure well with no immediate complications.    Labs Review Labs Reviewed - No data to display  Imaging Review Dg Ankle Complete Left  04/12/2014   CLINICAL DATA:  The patient stepped off a curb this afternoon and injured her left ankle. Pain and swelling. Initial encounter.  EXAM: LEFT ANKLE COMPLETE - 3+ VIEW  COMPARISON:  None.  FINDINGS: The patient has a fracture of the distal fibula which is oblique in orientation extending from the distal diaphysis anteriorly and  inferiorly through the metaphysis. There is also a posterior malleolar fracture which is mildly distracted. No other fracture is identified. There is soft tissue swelling about the ankle.  IMPRESSION: Distal fibular and posterior malleolar fractures as above.   Electronically Signed   By: Drusilla Kannerhomas  Dalessio M.D.   On: 04/12/2014 14:36     EKG Interpretation None       MDM   Final diagnoses:  Closed left ankle fracture, initial encounter    Patient here for evaluation of ankle pain following a mechanical fall. Patient with a closed left fibula and posterior malleolar fracture. She is neurovascularly intact distal to the injury. Discussed with Dr. Ophelia CharterYates with orthopedics who recommended splinting with follow-up in the office on Tuesday. Discussed with patient home plan and return precautions. There are no F there is no evidence of additional injuries on examination.  Molly FossaElizabeth Tacoya Altizer, MD 04/12/14 1629  On recheck pt now has right ankle pain.  There is now mildly increased swelling, ecchymosis and tenderness to the right lateral malleolous.  Plain films are pending.    Molly FossaElizabeth Jamerion Cabello, MD 04/12/14 1650

## 2014-04-12 NOTE — Discharge Instructions (Signed)
Do not put any weight on your ankle.    Ankle Fracture A fracture is a break in a bone. The ankle joint is made up of three bones. These include the lower (distal)sections of your lower leg bones, called the tibia and fibula, along with a bone in your foot, called the talus. Depending on how bad the break is and if more than one ankle joint bone is broken, a cast or splint is used to protect and keep your injured bone from moving while it heals. Sometimes, surgery is required to help the fracture heal properly.  There are two general types of fractures:  Stable fracture. This includes a single fracture line through one bone, with no injury to ankle ligaments. A fracture of the talus that does not have any displacement (movement of the bone on either side of the fracture line) is also stable.  Unstable fracture. This includes more than one fracture line through one or more bones in the ankle joint. It also includes fractures that have displacement of the bone on either side of the fracture line. CAUSES  A direct blow to the ankle.   Quickly and severely twisting your ankle.  Trauma, such as a car accident or falling from a significant height. RISK FACTORS You may be at a higher risk of ankle fracture if:  You have certain medical conditions.  You are involved in high-impact sports.  You are involved in a high-impact car accident. SIGNS AND SYMPTOMS   Tender and swollen ankle.  Bruising around the injured ankle.  Pain on movement of the ankle.  Difficulty walking or putting weight on the ankle.  A cold foot below the site of the ankle injury. This can occur if the blood vessels passing through your injured ankle were also damaged.  Numbness in the foot below the site of the ankle injury. DIAGNOSIS  An ankle fracture is usually diagnosed with a physical exam and X-rays. A CT scan may also be required for complex fractures. TREATMENT  Stable fractures are treated with a cast or  splint and using crutches to avoid putting weight on your injured ankle. This is followed by an ankle strengthening program. Some patients require a special type of cast, depending on other medical problems they may have. Unstable fractures require surgery to ensure the bones heal properly. Your health care provider will tell you what type of fracture you have and the best treatment for your condition. HOME CARE INSTRUCTIONS   Review correct crutch use with your health care provider and use your crutches as directed. Safe use of crutches is extremely important. Misuse of crutches can cause you to fall or cause injury to nerves in your hands or armpits.  Do not put weight or pressure on the injured ankle until directed by your health care provider.  To lessen the swelling, keep the injured leg elevated while sitting or lying down.  Apply ice to the injured area:  Put ice in a plastic bag.  Place a towel between your cast and the bag.  Leave the ice on for 20 minutes, 2-3 times a day.  If you have a plaster or fiberglass cast:  Do not try to scratch the skin under the cast with any objects. This can increase your risk of skin infection.  Check the skin around the cast every day. You may put lotion on any red or sore areas.  Keep your cast dry and clean.  If you have a plaster splint:  Wear the splint as directed.  You may loosen the elastic around the splint if your toes become numb, tingle, or turn cold or blue.  Do not put pressure on any part of your cast or splint; it may break. Rest your cast only on a pillow the first 24 hours until it is fully hardened.  Your cast or splint can be protected during bathing with a plastic bag sealed to your skin with medical tape. Do not lower the cast or splint into water.  Take medicines as directed by your health care provider. Only take over-the-counter or prescription medicines for pain, discomfort, or fever as directed by your health care  provider.  Do not drive a vehicle until your health care provider specifically tells you it is safe to do so.  If your health care provider has given you a follow-up appointment, it is very important to keep that appointment. Not keeping the appointment could result in a chronic or permanent injury, pain, and disability. If you have any problem keeping the appointment, call the facility for assistance. SEEK MEDICAL CARE IF: You develop increased swelling or discomfort. SEEK IMMEDIATE MEDICAL CARE IF:   Your cast gets damaged or breaks.  You have continued severe pain.  You develop new pain or swelling after the cast was put on.  Your skin or toenails below the injury turn blue or gray.  Your skin or toenails below the injury feel cold, numb, or have loss of sensitivity to touch.  There is a bad smell or pus draining from under the cast. MAKE SURE YOU:   Understand these instructions.  Will watch your condition.  Will get help right away if you are not doing well or get worse. Document Released: 02/26/2000 Document Revised: 03/05/2013 Document Reviewed: 09/27/2012 [facility] Patient Information 2015 [place], [place]. This information is not intended to replace advice given to you by your health care provider. Make sure you discuss any questions you have with your health care provider.  Cast or Splint Care Casts and splints support injured limbs and keep bones from moving while they heal. It is important to care for your cast or splint at home.  HOME CARE INSTRUCTIONS  Keep the cast or splint uncovered during the drying period. It can take 24 to 48 hours to dry if it is made of plaster. A fiberglass cast will dry in less than 1 hour.  Do not rest the cast on anything harder than a pillow for the first 24 hours.  Do not put weight on your injured limb or apply pressure to the cast until your health care provider gives you permission.  Keep the cast or splint dry. Wet casts or  splints can lose their shape and may not support the limb as well. A wet cast that has lost its shape can also create harmful pressure on your skin when it dries. Also, wet skin can become infected.  Cover the cast or splint with a plastic bag when bathing or when out in the rain or snow. If the cast is on the trunk of the body, take sponge baths until the cast is removed.  If your cast does become wet, dry it with a towel or a blow dryer on the cool setting only.  Keep your cast or splint clean. Soiled casts may be wiped with a moistened cloth.  Do not place any hard or soft foreign objects under your cast or splint, such as cotton, toilet paper, lotion, or powder.  Do not try to scratch the skin under the cast with any object. The object could get stuck inside the cast. Also, scratching could lead to an infection. If itching is a problem, use a blow dryer on a cool setting to relieve discomfort.  Do not trim or cut your cast or remove padding from inside of it.  Exercise all joints next to the injury that are not immobilized by the cast or splint. For example, if you have a long leg cast, exercise the hip joint and toes. If you have an arm cast or splint, exercise the shoulder, elbow, thumb, and fingers.  Elevate your injured arm or leg on 1 or 2 pillows for the first 1 to 3 days to decrease swelling and pain.It is best if you can comfortably elevate your cast so it is higher than your heart. SEEK MEDICAL CARE IF:   Your cast or splint cracks.  Your cast or splint is too tight or too loose.  You have unbearable itching inside the cast.  Your cast becomes wet or develops a soft spot or area.  You have a bad smell coming from inside your cast.  You get an object stuck under your cast.  Your skin around the cast becomes red or raw.  You have new pain or worsening pain after the cast has been applied. SEEK IMMEDIATE MEDICAL CARE IF:   You have fluid leaking through the cast.  You  are unable to move your fingers or toes.  You have discolored (blue or white), cool, painful, or very swollen fingers or toes beyond the cast.  You have tingling or numbness around the injured area.  You have severe pain or pressure under the cast.  You have any difficulty with your breathing or have shortness of breath.  You have chest pain. Document Released: 02/26/2000 Document Revised: 12/19/2012 Document Reviewed: 09/06/2012 [facility] Patient Information 2015 [place]. This information is not intended to replace advice given to you by your health care provider. Make sure you discuss any questions you have with your health care provider.

## 2014-04-12 NOTE — ED Notes (Signed)
Spoke with Ortho Tech, advised splint needs to be placed.

## 2014-04-12 NOTE — ED Notes (Signed)
Pt states she walking off a curb and felt her LT ankle roll then pop. Pt has some swelling to th Lt foot. EMS gave PT 4mg  IM. PT rates pain 5/10 on arrival.

## 2014-04-12 NOTE — Progress Notes (Signed)
Orthopedic Tech Progress Note Patient Details:  Hessie DibbleMona B Hamilton 05/04/1965 [id number] Applied fiberglass posterior splint and fiberglass stirrup splint to LLE. Pulses, motion, sensation intact before and after splinting.  Capillary refill less than 2 seconds before and after splinting.  Fit pt. for crutches.  While teaching use of crutches with pt. sitting on side of stretcher, pt. stated "I don't feel good.  I feel dizzy."  Laid pt. back down into a supine position on stretcher, raised safety rails and notified pt.'s nurse of this near-syncopal episode.   Once supine, pt. stated, "I feel a little better now." Pt. also began to complain of Rt. ankle pain.  Discontinued crutch training and notified pt.'s nurse of painful Rt. ankle. Ortho Devices Type of Ortho Device: Post (short leg) splint, Stirrup splint, Crutches Ortho Device/Splint Location: LLE Ortho Device/Splint Interventions: Application   Lesle ChrisGilliland, [name] L 04/12/2014, 4:57 PM

## 2014-04-12 NOTE — ED Provider Notes (Signed)
Received at sign out with XR right ankle pending. XR as below. T/C to Ortho Dr. Ophelia CharterYates, case discussed, including:  HPI, pertinent PM/SHx, VS/PE, dx testing, ED course and treatment: he has viewed both right and left ankle XR, requests to place right ankle in ASO, and she can be d/c home with outpt f/u with him on Tuesday as previously arranged. Pt and family agreeable with plan.     Dg Ankle Complete Right 04/12/2014   CLINICAL DATA:  Ankle pain after falling off a curb today  EXAM: RIGHT ANKLE - COMPLETE 3+ VIEW  COMPARISON:  None.  FINDINGS: There is a mildly displaced fibular tip fracture with overlying lateral malleolar soft tissue swelling. There are old-appearing fragments at the medial malleolar tip. The mortise is symmetric.  IMPRESSION: Fibular tip fracture with intact appearances of the mortise.   Electronically Signed   By: Ellery Plunkaniel R Mitchell M.D.   On: 04/12/2014 17:30     Samuel JesterKathleen Bernyce Brimley, DO 04/12/14 1843

## 2014-04-12 NOTE — ED Notes (Signed)
Ortho at bedside.

## 2014-04-12 NOTE — ED Notes (Signed)
Pt returned from xray

## 2014-07-09 ENCOUNTER — Ambulatory Visit: Payer: BLUE CROSS/BLUE SHIELD | Attending: Orthopaedic Surgery | Admitting: Physical Therapy

## 2014-07-09 DIAGNOSIS — M25572 Pain in left ankle and joints of left foot: Secondary | ICD-10-CM | POA: Insufficient documentation

## 2014-07-09 DIAGNOSIS — M25672 Stiffness of left ankle, not elsewhere classified: Secondary | ICD-10-CM | POA: Diagnosis not present

## 2014-07-09 NOTE — Patient Instructions (Signed)
Patient perform ankle ROM at home.

## 2014-07-09 NOTE — Therapy (Signed)
[facility] [address] [place], [place], [address] Phone: [phone]   Fax:  [phone]  Physical Therapy Evaluation  Patient Details  Name: Molly Hamilton MRN: [id number] Date of Birth: December 01, 1965 Referring Provider:  Eldred Manges, MD  Encounter Date: 07/09/2014      PT End of Session - 07/09/14 1102    Visit Number 1   Number of Visits 12   Date for PT Re-Evaluation 08/20/14   PT Start Time 1030   PT Stop Time 1119   PT Time Calculation (min) 49 min   Activity Tolerance Patient tolerated treatment well   Behavior During Therapy [facility] for tasks assessed/performed      Past Medical History  Diagnosis Date  . Glaucoma   . History of kidney stones     Past Surgical History  Procedure Laterality Date  . Abdominal hysterectomy    . Cesarean section      x2  . Tubal ligation    . Cystoscopy      with stone extraction  . Cholecystectomy N/A 05/10/2013    Procedure: LAPAROSCOPIC CHOLECYSTECTOMY;  Surgeon: Dalia Heading, MD;  Location: [facility];  Service: General;  Laterality: N/A;    There were no vitals filed for this visit.  Visit Diagnosis:  Left ankle pain - Plan: PT plan of care cert/re-cert  Ankle stiffness, left - Plan: PT plan of care cert/re-cert      Subjective Assessment - 07/09/14 1032    Subjective When up a lot my left ankle swells.  Now my left knee hurts and MD thinks this is related to my walking.   Limitations Walking   How long can you walk comfortably? 20 minutes.   Patient Stated Goals Walk without crutches at my daughter's wedding in May.   Currently in Pain? Yes   Pain Score 4    Pain Orientation Left   Pain Descriptors / Indicators Aching;Throbbing   Pain Type --  Sub-acute.   Pain Onset More than a month ago   Pain Frequency Constant   Aggravating Factors  Being up a lot.   Pain Relieving Factors Rest.            [name] PT Assessment - 07/09/14 0001    Assessment   Medical Diagnosis  Left ankle fiblar fracture.   Onset Date --  04/12/14.   Precautions   Precautions --  Progressively wean from crutches.   Restrictions   Weight Bearing Restrictions --  WBAT.   Balance Screen   Has the patient fallen in the past 6 months Yes   How many times? 1   Has the patient had a decrease in activity level because of a fear of falling?  No   Is the patient reluctant to leave their home because of a fear of falling?  No   ROM / Strength   AROM / PROM / Strength AROM;PROM;Strength   AROM   Overall AROM Comments Active left ankle dorsiflexion with knee extended= 2 degrees and with knee flexed= 4 degrees and inversion/eversion is full.   Strength   Overall Strength Comments Left ankle strength= 4/5.   Palpation   Palpation --  ender to palpation over left distal fibular region.   Special Tests    Special Tests --  Bi-malleolar circum measurement 2 cms > on left than right.   Ambulation/Gait   Gait Comments Decreased stance time over left LE while using bilateral axillary crutches.  [facility]Exercise - 07/09/14 0001    Modalities   Modalities Cryotherapy;Electrical Stimulation   Cryotherapy   Number Minutes Cryotherapy 15 Minutes   Cryotherapy Location --  Left ankle.   Type of Cryotherapy --  Medium vasopneumatic.   Programme researcher, broadcasting/film/videolectrical Stimulation   Electrical Stimulation Location --  Left distal fibula.   Electrical Stimulation Action --  80-150 HZ x 20 minutes.   Electrical Stimulation Goals Pain                     PT Long Term Goals - 07/09/14 1142    PT LONG TERM GOAL #1   Title Ind with advanced HEP.   Time 6   Period Weeks   Status New   PT LONG TERM GOAL #2   Title Increase dorsiflexion to 6- 8 degrees (with left knee in full extension) to normalize the patient's gait pattern   Time 6   Period Weeks   Status New   PT LONG TERM GOAL #3   Title Increase left ankle strength to 5/5 to increase stability.    Time 6   Period Weeks   Status New   PT LONG TERM GOAL #4   Title Walk without deviation a community distance with left ankle pain not > 2/10.               Plan - 07/09/14 1132    Clinical Impression Statement The patient reports stepping off a curb on 04/12/14 and sustained a left distal fibular fracture.  She was casted x 2 and then put in a CAM walker.  She is now out of the boot and is ambulating with bilateral axillary crutches.  Her pain-level is a 4/10 today.  She states that after being up and walking a lot her left ankle swells a lot.  She is performing ROM exercises at home.  Her goal is to walk without ssistance next month for her daughters wedding.   Pt will benefit from skilled therapeutic intervention in order to improve on the following deficits Abnormal gait;Decreased range of motion;Difficulty walking;Increased edema;Decreased activity tolerance;Pain;Decreased mobility   Rehab Potential Excellent   PT Frequency 2x / week   PT Duration 6 weeks   PT Treatment/Interventions ADLs/Self Care Home Management;Therapeutic activities;Patient/family education;Therapeutic exercise;Passive range of motion;Manual techniques;Cryotherapy;Neuromuscular re-education;Electrical Stimulation   PT Next Visit Plan Nustep; Rockerboard; standing gastroc-soleus stretch---progress to dynadisc and ankle isolator.  Please show T-band exercises for HEP.         Problem List There are no active problems to display for this patient.   Lesslie Mckeehan, [place] MPT 07/09/2014, 12:11 PM  [facility] [address] [place], [place], [address] Phone: [phone]   Fax:  [phone]

## 2014-07-14 ENCOUNTER — Ambulatory Visit: Payer: BLUE CROSS/BLUE SHIELD | Attending: Orthopaedic Surgery | Admitting: Physical Therapy

## 2014-07-14 ENCOUNTER — Encounter: Payer: Self-pay | Admitting: Physical Therapy

## 2014-07-14 DIAGNOSIS — M25572 Pain in left ankle and joints of left foot: Secondary | ICD-10-CM | POA: Diagnosis present

## 2014-07-14 DIAGNOSIS — M25672 Stiffness of left ankle, not elsewhere classified: Secondary | ICD-10-CM | POA: Insufficient documentation

## 2014-07-14 NOTE — Therapy (Signed)
[facility] [address] [place], [place], [address] Phone: [phone]   Fax:  [phone]  Physical Therapy Treatment  Patient Details  Name: Molly Hamilton MRN: [id number] Date of Birth: 01-08-1966 Referring Provider:  Butch Penny, MD  Encounter Date: 07/14/2014      PT End of Session - 07/14/14 1041    Visit Number 2   Number of Visits 12   Date for PT Re-Evaluation 08/20/14   PT Start Time 1038   PT Stop Time 1113   PT Time Calculation (min) 35 min   Activity Tolerance Patient tolerated treatment well   Behavior During Therapy [facility] for tasks assessed/performed      Past Medical History  Diagnosis Date  . Glaucoma   . History of kidney stones     Past Surgical History  Procedure Laterality Date  . Abdominal hysterectomy    . Cesarean section      x2  . Tubal ligation    . Cystoscopy      with stone extraction  . Cholecystectomy N/A 05/10/2013    Procedure: LAPAROSCOPIC CHOLECYSTECTOMY;  Surgeon: Dalia Heading, MD;  Location: [facility];  Service: General;  Laterality: N/A;    There were no vitals filed for this visit.  Visit Diagnosis:  Left ankle pain  Ankle stiffness, left      Subjective Assessment - 07/14/14 1116    Subjective When she ambulates for prolonged periods she has some pain. Continues to report L knee pain. Does not ice and reports tenderness around L lateral malleolus.   Limitations Walking   Patient Stated Goals Walk without crutches at my daughter's wedding in May.   Currently in Pain? Yes   Pain Score 3    Pain Location Ankle   Pain Orientation Left            [name] PT Assessment - 07/14/14 0001    Assessment   Medical Diagnosis Left ankle fiblar fracture.   Next MD Visit 07/31/2014                     [facility] Adult PT Treatment/Exercise - 07/14/14 0001    Ambulation/Gait   Ambulation/Gait Yes   Ambulation/Gait Assistance 5: Supervision   Ambulation/Gait Assistance  Details Modified 2 point gait pattern with crutches   Ambulation Distance (Feet) 270 Feet   Assistive device Crutches  R crutch   Gait Pattern Decreased step length - left;Decreased stance time - left;Antalgic;Lateral trunk lean to right;Narrow base of support  L toe out   Ambulation Surface Level;Indoor   Gait velocity Decreased due to learning new gait pattern and correcting gait deviations   Gait Comments Required verbal cueing and demonstration for mod 2 pt gait pattern, L heel/toe, L toe in, standing erect. Crutches increased height x1 to prevent lateral trunk lean to R during ambulation.   Exercises   Exercises Ankle   Ankle Exercises: Stretches   Soleus Stretch 3 reps;30 seconds   Gastroc Stretch 3 reps;30 seconds   Ankle Exercises: Standing   Rocker Board 3 minutes   Ankle Exercises: Seated   ABC's 2 reps   Other Seated Ankle Exercises Seated Dynadisc circles x14min   Ankle Exercises: Supine   T-Band 4 way with yellow theraband x30 reps each                PT Education - 07/14/14 1053    Education provided Yes   Education Details HEP- 4 way ankle strengthening  with yellow theraband   Person(s) Educated Patient   Methods Explanation;Demonstration;Verbal cues;Handout;Tactile cues   Comprehension Verbalized understanding;Returned demonstration;Verbal cues required;Tactile cues required             PT Long Term Goals - 07/14/14 1121    PT LONG TERM GOAL #1   Title Ind with advanced HEP.   Time 6   Period Weeks   Status On-going   PT LONG TERM GOAL #2   Title Increase dorsiflexion to 6- 8 degrees (with left knee in full extension) to normalize the patient's gait pattern   Time 6   Period Weeks   Status On-going   PT LONG TERM GOAL #3   Title Increase left ankle strength to 5/5 to increase stability.   Time 6   Period Weeks   Status On-going   PT LONG TERM GOAL #4   Title Walk without deviation a community distance with left ankle pain not > 2/10.    Status On-going               Plan - 07/14/14 1117    Clinical Impression Statement Patient tolerated treatment well without complaint of increased pain during exercises or gait training. Able to progress gait training to R side crutch. Required moderate verbal cueing and demostration for gait deviation corrections. Welcomed new HEP exercises. Experienced 3/10 pain in L ankle following treatment.   Pt will benefit from skilled therapeutic intervention in order to improve on the following deficits Abnormal gait;Decreased range of motion;Difficulty walking;Increased edema;Decreased activity tolerance;Pain;Decreased mobility   Rehab Potential Excellent   PT Frequency 2x / week   PT Duration 6 weeks   PT Next Visit Plan Continue per PT POC. Initiate nustep next session and review HEP.   Consulted and Agree with Plan of Care Patient        Problem List There are no active problems to display for this patient.   Evelene CroonKelsey M Parsons, PTA 07/14/2014, 11:24 AM  [facility] [address] [place], [place], [address] Phone: [phone]   Fax:  [phone]

## 2014-07-14 NOTE — Patient Instructions (Signed)
Dorsiflexion: Resisted   Facing anchor, tubing around left foot, pull toward face.  Repeat __10__ times per set. Do __2-3__ sets per session. Do ___2_ sessions per day.  http://orth.exer.us/8   Copyright  VHI. All rights reserved.  Plantar Flexion: Resisted   Anchor behind, tubing around left foot, press down. Repeat __10__ times per set. Do _2-3___ sets per session. Do __2__ sessions per day.  http://orth.exer.us/10   Copyright  VHI. All rights reserved.  Inversion: Resisted   Cross legs with right leg underneath, foot in tubing loop. Hold tubing around other foot to resist and turn foot in. Repeat __10__ times per set. Do __2-3__ sets per session. Do _2___ sessions per day.  http://orth.exer.us/12   Copyright  VHI. All rights reserved.  Eversion: Resisted   With right foot in tubing loop, hold tubing around other foot to resist and turn foot out. Repeat __10__ times per set. Do _2-3___ sets per session. Do _2___ sessions per day.  http://orth.exer.us/14   Copyright  VHI. All rights reserved.

## 2014-07-17 ENCOUNTER — Encounter: Payer: Self-pay | Admitting: Physical Therapy

## 2014-07-17 ENCOUNTER — Ambulatory Visit: Payer: BLUE CROSS/BLUE SHIELD | Admitting: Physical Therapy

## 2014-07-17 DIAGNOSIS — M25672 Stiffness of left ankle, not elsewhere classified: Secondary | ICD-10-CM

## 2014-07-17 DIAGNOSIS — M25572 Pain in left ankle and joints of left foot: Secondary | ICD-10-CM

## 2014-07-17 NOTE — Therapy (Signed)
[facility] [address] [place], [place], [address] Phone: [phone]   Fax:  [phone]  Physical Therapy Treatment  Patient Details  Name: Molly DibbleMona B Hamilton MRN: [id number] Date of Birth: 02/25/1966 Referring Provider:  Butch PennyMcInnis, Angus, MD  Encounter Date: 07/17/2014      PT End of Session - 07/17/14 1045    Visit Number 3   Number of Visits 12   Date for PT Re-Evaluation 08/20/14   PT Start Time 1034   PT Stop Time 1115   PT Time Calculation (min) 41 min   Activity Tolerance Patient tolerated treatment well   Behavior During Therapy [facility] for tasks assessed/performed      Past Medical History  Diagnosis Date  . Glaucoma   . History of kidney stones     Past Surgical History  Procedure Laterality Date  . Abdominal hysterectomy    . Cesarean section      x2  . Tubal ligation    . Cystoscopy      with stone extraction  . Cholecystectomy N/A 05/10/2013    Procedure: LAPAROSCOPIC CHOLECYSTECTOMY;  Surgeon: Dalia HeadingMark A Jenkins, MD;  Location: [facility];  Service: General;  Laterality: N/A;    There were no vitals filed for this visit.  Visit Diagnosis:  Left ankle pain  Ankle stiffness, left      Subjective Assessment - 07/17/14 1044    Subjective States that she did good after last treatment and slept better that night. Is doing good with one crutch ambulation. States she is a little sore.    Limitations Walking   How long can you walk comfortably? 20 minutes.   Patient Stated Goals Walk without crutches at my daughter's wedding in May.   Currently in Pain? No/denies            [facility] - 07/17/14 0001    Assessment   Medical Diagnosis Left ankle fiblar fracture.   Next MD Visit 07/31/2014                     [facility] Treatment/Exercise - 07/17/14 0001    Ankle Exercises: Aerobic   Stationary Bike NuStep L3 x13 min   Ankle Exercises: Standing   Rocker Board 2 minutes  Terminated early due  to reports of pain   Toe Raise 20 reps   Other Standing Ankle Exercises DLS on Bosu [id number] for proprioception,   Terminated due to reports of pain   Ankle Exercises: Seated   ABC's 2 reps   Other Seated Ankle Exercises Seated BAPS DF/PF x904min, EV/INV x154min, Circles x334min   Other Seated Ankle Exercises Seated Dynadisc circles x336min   Ankle Exercises: Stretches   Soleus Stretch 3 reps;30 seconds   Gastroc Stretch 3 reps;30 seconds                     PT Long Term Goals - 07/17/14 1048    PT LONG TERM GOAL #1   Title Ind with advanced HEP.   Time 6   Period Weeks   Status Achieved   PT LONG TERM GOAL #2   Title Increase dorsiflexion to 6- 8 degrees (with left knee in full extension) to normalize the patient's gait pattern   Time 6   Period Weeks   Status On-going   PT LONG TERM GOAL #3   Title Increase left ankle strength to 5/5 to increase stability.   Time 6   Period Weeks  Status On-going   PT LONG TERM GOAL #4   Title Walk without deviation a community distance with left ankle pain not > 2/10.   Status On-going               Plan - 07/17/14 1124    Clinical Impression Statement Patient tolerated treatment well with only some complaints of pain during rockerboard and DLS on Bosu. Compliant with HEP. Denied pain following treatment. Achieved LT goal #1 regarding independence with HEP.    Pt will benefit from skilled therapeutic intervention in order to improve on the following deficits Abnormal gait;Decreased range of motion;Difficulty walking;Increased edema;Decreased activity tolerance;Pain;Decreased mobility   Rehab Potential Excellent   PT Frequency 2x / week   PT Duration 6 weeks   PT Treatment/Interventions ADLs/Self Care Home Management;Therapeutic activities;Patient/family education;Therapeutic exercise;Passive range of motion;Manual techniques;Cryotherapy;Neuromuscular re-education;Electrical Stimulation   PT Next Visit Plan Continue per PT POC.  Communicate with MPT regarding advancement to ambulation without crutch in time for her daughter's wedding next weekend.   Consulted and Agree with Plan of Care Patient        Problem List There are no active problems to display for this patient.   Evelene CroonKelsey M Parsons, PTA 07/17/2014, 11:30 AM  [facility] [address] [place], [place], [address] Phone: [phone]   Fax:  [phone]

## 2014-07-22 ENCOUNTER — Ambulatory Visit: Payer: BLUE CROSS/BLUE SHIELD | Admitting: Physical Therapy

## 2014-07-22 DIAGNOSIS — M25572 Pain in left ankle and joints of left foot: Secondary | ICD-10-CM

## 2014-07-22 DIAGNOSIS — M25672 Stiffness of left ankle, not elsewhere classified: Secondary | ICD-10-CM

## 2014-07-22 NOTE — Therapy (Signed)
[facility] [address] [place], [place], [address] Phone: [phone]   Fax:  [phone]  Physical Therapy Treatment  Patient Details  Name: Molly Hamilton MRN: [id number] Date of Birth: 04/28/1965 Referring Provider:  Butch PennyMcInnis, Angus, MD  Encounter Date: 07/22/2014      PT End of Session - 07/22/14 1112    Visit Number 4   Number of Visits 12   Date for PT Re-Evaluation 08/20/14   PT Start Time 1030   PT Stop Time 1121   PT Time Calculation (min) 51 min      Past Medical History  Diagnosis Date  . Glaucoma   . History of kidney stones     Past Surgical History  Procedure Laterality Date  . Abdominal hysterectomy    . Cesarean section      x2  . Tubal ligation    . Cystoscopy      with stone extraction  . Cholecystectomy N/A 05/10/2013    Procedure: LAPAROSCOPIC CHOLECYSTECTOMY;  Surgeon: Dalia HeadingMark A Jenkins, MD;  Location: [facility];  Service: General;  Laterality: N/A;    There were no vitals filed for this visit.  Visit Diagnosis:  Left ankle pain  Ankle stiffness, left      Subjective Assessment - 07/22/14 1054    Subjective Doing pretty good today.   Pain Score 3    Pain Location Ankle   Pain Orientation Left   Pain Descriptors / Indicators Aching;Throbbing                         [name] Adult PT Treatment/Exercise - 07/22/14 0001    Modalities   Modalities Cryotherapy;Electrical Stimulation   Cryotherapy   Number Minutes Cryotherapy 15 Minutes   Cryotherapy Location --  Left ankle.   Programme researcher, broadcasting/film/videolectrical Stimulation   Electrical Stimulation Location --  Left ankle distal fibular area.   Electrical Stimulation Action --  80-150 HZ constant x 15 minutes.   Electrical Stimulation Goals Edema;Pain   Manual Therapy   Manual Therapy --  Left ankle PROM into D-flex knee  flexed and ext x 5 minutes   Ankle Exercises: Aerobic   Stationary Bike Nustep x 15 minutes.   Ankle Exercises: Standing   Rocker  Board 5 minutes                     PT Long Term Goals - 07/17/14 1048    PT LONG TERM GOAL #1   Title Ind with advanced HEP.   Time 6   Period Weeks   Status Achieved   PT LONG TERM GOAL #2   Title Increase dorsiflexion to 6- 8 degrees (with left knee in full extension) to normalize the patient's gait pattern   Time 6   Period Weeks   Status On-going   PT LONG TERM GOAL #3   Title Increase left ankle strength to 5/5 to increase stability.   Time 6   Period Weeks   Status On-going   PT LONG TERM GOAL #4   Title Walk without deviation a community distance with left ankle pain not > 2/10.   Status On-going               Plan - 07/22/14 1114    Pt will benefit from skilled therapeutic intervention in order to improve on the following deficits Abnormal gait;Decreased range of motion;Difficulty walking;Increased edema;Decreased activity tolerance;Pain;Decreased mobility   Rehab Potential Excellent   PT  Frequency 2x / week   PT Duration 6 weeks   PT Treatment/Interventions ADLs/Self Care Home Management;Therapeutic activities;Patient/family education;Therapeutic exercise;Passive range of motion;Manual techniques;Cryotherapy;Neuromuscular re-education;Electrical Stimulation        Problem List There are no active problems to display for this patient.   Aleanna Menge, ItalyHAD MPT 07/22/2014, 11:21 AM  [facility] [address] [place], [place], [address] Phone: [phone]   Fax:  [phone]

## 2014-07-24 ENCOUNTER — Encounter: Payer: Self-pay | Admitting: *Deleted

## 2014-07-24 ENCOUNTER — Ambulatory Visit: Payer: BLUE CROSS/BLUE SHIELD | Admitting: *Deleted

## 2014-07-24 DIAGNOSIS — M25572 Pain in left ankle and joints of left foot: Secondary | ICD-10-CM

## 2014-07-24 DIAGNOSIS — M25672 Stiffness of left ankle, not elsewhere classified: Secondary | ICD-10-CM

## 2014-07-24 NOTE — Therapy (Signed)
[facility] [address] [place], [place], [address] Phone: [phone]   Fax:  [phone]  Physical Therapy Treatment  Patient Details  Name: Molly Hamilton MRN: [id number] Date of Birth: 04/06/1965 Referring Provider:  Butch PennyMcInnis, Angus, MD  Encounter Date: 07/24/2014    Past Medical History  Diagnosis Date  . Glaucoma   . History of kidney stones     Past Surgical History  Procedure Laterality Date  . Abdominal hysterectomy    . Cesarean section      x2  . Tubal ligation    . Cystoscopy      with stone extraction  . Cholecystectomy N/A 05/10/2013    Procedure: LAPAROSCOPIC CHOLECYSTECTOMY;  Surgeon: Dalia HeadingMark A Jenkins, MD;  Location: [facility];  Service: General;  Laterality: N/A;    There were no vitals filed for this visit.  Visit Diagnosis:  Left ankle pain  Ankle stiffness, left      Subjective Assessment - 07/24/14 1143    Subjective (p) Being up on feet for long periods increase pain and swelling. Pt voices improvement since she has been in therapy. 3/10 pain   Limitations (p) Walking   How long can you walk comfortably? (p) 20 min   Currently in Pain? (p) Yes   Pain Score (p) 3    Pain Location (p) Ankle   Pain Orientation (p) Left   Pain Descriptors / Indicators (p) Aching;Throbbing   Pain Type (p) Acute pain                         [name] Adult PT Treatment/Exercise - 07/24/14 0001    Exercises   Exercises Ankle   Modalities   Modalities --  cryrotherapy, ES   Cryotherapy   Number Minutes Cryotherapy --  15   Cryotherapy Location --  lt ankle   Type of Cryotherapy --  vasopneumatic   Electrical Stimulation   Electrical Stimulation Location --  lt distal ankle   Electrical Stimulation Action --  80-150hz    Electrical Stimulation Goals --  edema and pain   Manual Therapy   Manual Therapy --  gental stretching   Ankle Exercises: Aerobic   Stationary Bike --  nustep x 15 min L4   Ankle  Exercises: Standing   Rocker Board --  3 min   Ankle Exercises: Supine   T-Band --  4 way x 20 each                     PT Long Term Goals - 07/17/14 1048    PT LONG TERM GOAL #1   Title Ind with advanced HEP.   Time 6   Period Weeks   Status Achieved   PT LONG TERM GOAL #2   Title Increase dorsiflexion to 6- 8 degrees (with left knee in full extension) to normalize the patient's gait pattern   Time 6   Period Weeks   Status On-going   PT LONG TERM GOAL #3   Title Increase left ankle strength to 5/5 to increase stability.   Time 6   Period Weeks   Status On-going   PT LONG TERM GOAL #4   Title Walk without deviation a community distance with left ankle pain not > 2/10.   Status On-going               Problem List There are no active problems to display for this patient.   Renessa Wellnitz, Harrah's EntertainmentJOANNA  P,PTA 07/24/2014, 12:53 PM  [facility] [address] [place], [place], [address] Phone: [phone]   Fax:  [phone]

## 2014-07-29 ENCOUNTER — Ambulatory Visit: Payer: BLUE CROSS/BLUE SHIELD | Admitting: *Deleted

## 2014-07-29 ENCOUNTER — Encounter: Payer: Self-pay | Admitting: *Deleted

## 2014-07-29 DIAGNOSIS — M25572 Pain in left ankle and joints of left foot: Secondary | ICD-10-CM | POA: Diagnosis not present

## 2014-07-29 DIAGNOSIS — M25672 Stiffness of left ankle, not elsewhere classified: Secondary | ICD-10-CM

## 2014-07-29 NOTE — Therapy (Signed)
[facility] [address] [place], [place], [address] Phone: [phone]   Fax:  [phone]  Physical Therapy Treatment  Patient Details  Name: Molly Hamilton MRN: [id number] Date of Birth: 03/30/1965 Referring Provider:  Butch PennyMcInnis, Angus, MD  Encounter Date: 07/29/2014      PT End of Session - 07/29/14 1312    Visit Number 5   Number of Visits 12   Date for PT Re-Evaluation 08/20/14   PT Start Time 1115   PT Stop Time 1209   PT Time Calculation (min) 54 min      Past Medical History  Diagnosis Date  . Glaucoma   . History of kidney stones     Past Surgical History  Procedure Laterality Date  . Abdominal hysterectomy    . Cesarean section      x2  . Tubal ligation    . Cystoscopy      with stone extraction  . Cholecystectomy N/A 05/10/2013    Procedure: LAPAROSCOPIC CHOLECYSTECTOMY;  Surgeon: Dalia HeadingMark A Jenkins, MD;  Location: [facility];  Service: General;  Laterality: N/A;    There were no vitals filed for this visit.  Visit Diagnosis:  Left ankle pain  Ankle stiffness, left      Subjective Assessment - 07/29/14 1122    Subjective LT ankle swells  when I stand a lot. I did a lot of standing this weekend for my daughters wedding   Limitations Walking   How long can you walk comfortably? 20 minutes.   Patient Stated Goals Walk without crutches at my daughter's wedding in May.   Currently in Pain? Yes   Pain Score 3    Pain Location Ankle   Pain Orientation Left   Pain Descriptors / Indicators Aching;Throbbing            [name] PT Assessment - 07/29/14 0001    AROM   Overall AROM  Within functional limits for tasks performed   Overall AROM Comments 12-45 degrees DF/PF,  10 degrees EV, 30 degrees  IV                     [name] Adult PT Treatment/Exercise - 07/29/14 0001    Ambulation/Gait   Ambulation/Gait Yes   Gait Pattern Step-through pattern;Decreased step length - left  RT HIP ER   Exercises   Exercises Ankle   Modalities   Modalities Cryotherapy;Electrical Stimulation   Cryotherapy   Number Minutes Cryotherapy 15 Minutes   Cryotherapy Location Ankle   Type of Cryotherapy Ice pack  Vasopnuematic   Programme researcher, broadcasting/film/videolectrical Stimulation   Electrical Stimulation Location LT ankle   Electrical Stimulation Action Pre-mod   Electrical Stimulation Goals Edema;Pain   Manual Therapy   Manual Therapy Passive ROM   Passive ROM To LT ankle all motions with focus on EV, with Pt supine   Ankle Exercises: Aerobic   Stationary Bike Nustep x 12 minutes.   Ankle Exercises: Standing   Rocker Board 5 minutes   Heel Raises 10 reps  Bil. heel Raises.  Pt had medial ankle pain  and had to stop   Toe Raise 20 reps   Other Standing Ankle Exercises Dyna disc LT ankle all directions 3x10                     PT Long Term Goals - 07/17/14 1048    PT LONG TERM GOAL #1   Title Ind with advanced HEP.   Time 6  Period Weeks   Status Achieved   PT LONG TERM GOAL #2   Title Increase dorsiflexion to 6- 8 degrees (with left knee in full extension) to normalize the patient's gait pattern   Time 6   Period Weeks   Status On-going   PT LONG TERM GOAL #3   Title Increase left ankle strength to 5/5 to increase stability.   Time 6   Period Weeks   Status On-going   PT LONG TERM GOAL #4   Title Walk without deviation a community distance with left ankle pain not > 2/10.   Status On-going               Plan - 07/29/14 1316    Clinical Impression Statement Pt did fairly well with Rx today. She still ambulates with antalgic pattern and ER of LT hip, but pt. feels that it is mostly habitual. She still uses 1 crutch PRN. Her LT ankle ROM was good except for mild Eversion deficit (10 degrees today) She continues to have increased swelling if she is on her feet  for awhile.    Pt will benefit from skilled therapeutic intervention in order to improve on the following deficits Abnormal gait;Decreased  range of motion;Difficulty walking;Increased edema;Decreased activity tolerance;Pain;Decreased mobility   Rehab Potential Excellent   PT Frequency 2x / week   PT Duration 6 weeks   PT Treatment/Interventions ADLs/Self Care Home Management;Therapeutic activities;Patient/family education;Therapeutic exercise;Passive range of motion;Manual techniques;Cryotherapy;Neuromuscular re-education;Electrical Stimulation   PT Next Visit Plan Continue per PT POC and MD. Pt to F/U with MD Thursday   Consulted and Agree with Plan of Care Patient        Problem List There are no active problems to display for this patient.   RAMSEUR,CHRIS, PTA 07/29/2014, 1:46 PM  [facility] [address] [place], [place], [address] Phone: [phone]   Fax:  [phone]

## 2014-08-01 ENCOUNTER — Ambulatory Visit: Payer: BLUE CROSS/BLUE SHIELD | Admitting: *Deleted

## 2014-08-01 ENCOUNTER — Encounter: Payer: Self-pay | Admitting: *Deleted

## 2014-08-01 DIAGNOSIS — M25572 Pain in left ankle and joints of left foot: Secondary | ICD-10-CM

## 2014-08-01 DIAGNOSIS — M25672 Stiffness of left ankle, not elsewhere classified: Secondary | ICD-10-CM

## 2014-08-01 NOTE — Therapy (Signed)
[facility] [address] [place], [place], [address] Phone: [phone]   Fax:  [phone]  Physical Therapy Treatment  Patient Details  Name: Molly DibbleMona B Hsiao MRN: [id number] Date of Birth: 06/07/1965 Referring Provider:  Butch PennyMcInnis, Angus, MD  Encounter Date: 08/01/2014      PT End of Session - 08/01/14 1029    Visit Number 6   Number of Visits 12   Date for PT Re-Evaluation 08/20/14   PT Start Time 1030   PT Stop Time 1120   PT Time Calculation (min) 50 min      Past Medical History  Diagnosis Date  . Glaucoma   . History of kidney stones     Past Surgical History  Procedure Laterality Date  . Abdominal hysterectomy    . Cesarean section      x2  . Tubal ligation    . Cystoscopy      with stone extraction  . Cholecystectomy N/A 05/10/2013    Procedure: LAPAROSCOPIC CHOLECYSTECTOMY;  Surgeon: Dalia HeadingMark A Jenkins, MD;  Location: [facility];  Service: General;  Laterality: N/A;    There were no vitals filed for this visit.  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 Got a cortisone shot in the LT knee   Limitations Walking   How long can you walk comfortably? 20 minutes.   Patient Stated Goals Walk without crutches at my daughter's wedding in May.   Currently in Pain? No/denies   Pain Location Ankle   Pain Orientation Left   Pain Onset More than a month ago   Aggravating Factors  Being up a lot, swelling   Pain Relieving Factors ice                         [name] Adult PT Treatment/Exercise - 08/01/14 0001    Ambulation/Gait   Ambulation/Gait Yes   Gait Pattern Step-through pattern;Decreased step length - left  LT HIP ER in Hallway   Gait Comments Practiced heel-toe gait pattern and focused on decreasing ER of  LT hip   Exercises   Exercises  Ankle   Ankle Exercises: Aerobic   Stationary Bike Nustep x 15 minutes.L4 with focus on good joint alignment   Ankle Exercises: Standing   Rocker Board 5 minutes  stretching and balance   Other Standing Ankle Exercises Dyna disc LT ankle all directions 3x10   Ankle Exercises: Seated   Other Seated Ankle Exercises Yellow Tband EV and PF 3x10 each   Other Seated Ankle Exercises Ankle Isolator 1/2# DF, circles each way 3x10                     PT Long Term Goals - 07/17/14 1048    PT LONG TERM GOAL #1   Title Ind with advanced HEP.   Time 6   Period Weeks   Status Achieved   PT LONG TERM GOAL #2   Title Increase dorsiflexion to 6- 8 degrees (with left knee in full extension) to normalize the patient's gait pattern   Time 6   Period Weeks   Status On-going   PT LONG TERM GOAL #3   Title Increase left ankle strength to 5/5 to increase stability.   Time  6   Period Weeks   Status On-going   PT LONG TERM GOAL #4   Title Walk without deviation a community distance with left ankle pain not > 2/10.   Status On-going               Plan - 08/01/14 1030    Clinical Impression Statement Pt did better  today with gait pattern, but needs V/C's for heel-toe gait pattern and decrease LT LE ER. She was able to perform all therex exs with minimal increase in soreness. Goals are on-going   Pt will benefit from skilled therapeutic intervention in order to improve on the following deficits Abnormal gait;Decreased range of motion;Difficulty walking;Increased edema;Decreased activity tolerance;Pain;Decreased mobility   Rehab Potential Excellent   PT Frequency 2x / week   PT Treatment/Interventions ADLs/Self Care Home Management;Therapeutic activities;Patient/family education;Therapeutic exercise;Passive range of motion;Manual techniques;Cryotherapy;Neuromuscular re-education;Electrical Stimulation   PT Next Visit Plan Continue per PT POC and MD. Focus on proprioception and gait.    Consulted and Agree with Plan of Care Patient        Problem List There are no active problems to display for this patient.   Hamilton,Molly, PTA 08/01/2014, 11:41 AM  [facility] [address] [place], [place], [address] Phone: [phone]   Fax:  [phone]

## 2014-08-05 ENCOUNTER — Encounter: Payer: Self-pay | Admitting: *Deleted

## 2014-08-05 ENCOUNTER — Ambulatory Visit: Payer: BLUE CROSS/BLUE SHIELD | Admitting: *Deleted

## 2014-08-05 DIAGNOSIS — M25572 Pain in left ankle and joints of left foot: Secondary | ICD-10-CM

## 2014-08-05 DIAGNOSIS — M25672 Stiffness of left ankle, not elsewhere classified: Secondary | ICD-10-CM

## 2014-08-05 NOTE — Therapy (Signed)
[facility] [address] [place], [place], [address] Phone: [phone]   Fax:  [phone]  Physical Therapy Treatment  Patient Details  Name: Molly Hamilton MRN: [id number] Date of Birth: 02/24/1966 Referring Provider:  Butch PennyMcInnis, Angus, MD  Encounter Date: 08/05/2014      PT End of Session - 08/05/14 1317    Visit Number 7   Number of Visits 12   Date for PT Re-Evaluation 08/20/14   PT Start Time 1115   PT Stop Time 1206   PT Time Calculation (min) 51 min      Past Medical History  Diagnosis Date  . Glaucoma   . History of kidney stones     Past Surgical History  Procedure Laterality Date  . Abdominal hysterectomy    . Cesarean section      x2  . Tubal ligation    . Cystoscopy      with stone extraction  . Cholecystectomy N/A 05/10/2013    Procedure: LAPAROSCOPIC CHOLECYSTECTOMY;  Surgeon: Dalia HeadingMark A Jenkins, MD;  Location: [facility];  Service: General;  Laterality: N/A;    There were no vitals filed for this visit.  Visit Diagnosis:  Left ankle pain  Ankle stiffness, left      Subjective Assessment - 08/05/14 1122    Subjective Did ok after last Rx. LT foot is mainly sore today   Limitations Walking   How long can you walk comfortably? 20 minutes.   Patient Stated Goals Walk without crutches at my daughter's wedding in May.   Pain Score 2    Pain Location Ankle   Pain Orientation Left   Pain Descriptors / Indicators Aching;Throbbing   Pain Onset More than a month ago   Aggravating Factors  swelling   Pain Relieving Factors rest                         [name] Adult PT Treatment/Exercise - 08/05/14 0001    Ambulation/Gait   Gait Pattern Step-through pattern;Decreased step length - left  LT HIP ER in Hallway   Gait Comments Practiced heel-toe gait pattern and focused on decreasing ER of  LT hip   Exercises   Exercises Ankle   Manual Therapy   Passive ROM To LT ankle all motions with focus on EV,  and DF with Pt sitting   Ankle Exercises: Aerobic   Stationary Bike Nustep x 10 minutes.L4 with focus on good joint alignment   Ankle Exercises: Standing   Rocker Board 5 minutes  stretching and balance   Other Standing Ankle Exercises Dyna disc LT ankle all directions 3x10   Ankle Exercises: Seated   Other Seated Ankle Exercises Ankle Isolator 1# DF, circles each way 3x10                                                                                                    Gait up/down steps using hand rail                 PT Long Term Goals - 07/17/14  1048    PT LONG TERM GOAL #1   Title Ind with advanced HEP.   Time 6   Period Weeks   Status Achieved   PT LONG TERM GOAL #2   Title Increase dorsiflexion to 6- 8 degrees (with left knee in full extension) to normalize the patient's gait pattern   Time 6   Period Weeks   Status On-going   PT LONG TERM GOAL #3   Title Increase left ankle strength to 5/5 to increase stability.   Time 6   Period Weeks   Status On-going   PT LONG TERM GOAL #4   Title Walk without deviation a community distance with left ankle pain not > 2/10.   Status On-going               Plan - 08/05/14 1319    Clinical Impression Statement Pt did fairly well today with Exs and Acts. Her gait cycle was less antalgic and she has less ER at LT hip when she is concentratin on her technique. She was able to perform Reciprocol pattern up steps, but unable to perform it going down due to Anterior pain LT ankle   Pt will benefit from skilled therapeutic intervention in order to improve on the following deficits Abnormal gait;Decreased range of motion;Difficulty walking;Increased edema;Decreased activity tolerance;Pain;Decreased mobility   Rehab Potential Excellent   PT Frequency 2x / week   PT Duration 6 weeks   PT Treatment/Interventions ADLs/Self Care Home Management;Therapeutic activities;Patient/family education;Therapeutic exercise;Passive range of  motion;Manual techniques;Cryotherapy;Neuromuscular re-education;Electrical Stimulation   PT Next Visit Plan Continue per PT POC and MD. Focus on proprioception and gait. D/C?   Consulted and Agree with Plan of Care Patient        Problem List There are no active problems to display for this patient.   RAMSEUR,CHRIS, PTA 08/05/2014, 2:24 PM  [facility] [address] [place], [place], [address] Phone: [phone]   Fax:  [phone]

## 2014-08-08 ENCOUNTER — Encounter: Payer: Self-pay | Admitting: *Deleted

## 2014-08-08 ENCOUNTER — Ambulatory Visit: Payer: BLUE CROSS/BLUE SHIELD | Admitting: *Deleted

## 2014-08-08 DIAGNOSIS — M25572 Pain in left ankle and joints of left foot: Secondary | ICD-10-CM | POA: Diagnosis not present

## 2014-08-08 DIAGNOSIS — M25672 Stiffness of left ankle, not elsewhere classified: Secondary | ICD-10-CM

## 2014-08-08 NOTE — Therapy (Signed)
[facility] [place], [place], [address] Phone: [phone]   Fax:  [phone]  Physical Therapy Treatment  Patient Details  Name: Molly Hamilton MRN: [id number] Date of Birth: 06/15/1965 Referring Provider:  Marjean Donna, MD  Encounter Date: 08/08/2014      PT End of Session - 08/08/14 1043    Visit Number 8   Number of Visits 12   Date for PT Re-Evaluation 08/20/14   PT Start Time 1030   PT Stop Time 1120   PT Time Calculation (min) 50 min      Past Medical History  Diagnosis Date  . Glaucoma   . History of kidney stones     Past Surgical History  Procedure Laterality Date  . Abdominal hysterectomy    . Cesarean section      x2  . Tubal ligation    . Cystoscopy      with stone extraction  . Cholecystectomy N/A 05/10/2013    Procedure: LAPAROSCOPIC CHOLECYSTECTOMY;  Surgeon: Jamesetta So, MD;  Location: [facility];  Service: General;  Laterality: N/A;    There were no vitals filed for this visit.  Visit Diagnosis:  Left ankle pain  Ankle stiffness, left      Subjective Assessment - 08/08/14 1041    Subjective Did ok after last Rx. LT foot is mainly sore today. DC today   Limitations Walking   How long can you walk comfortably? 20 minutes.   Patient Stated Goals Walk without crutches at my daughter's wedding in May.   Pain Score 3    Pain Location Ankle   Pain Orientation Left   Pain Descriptors / Indicators Aching;Throbbing   Pain Onset More than a month ago   Aggravating Factors  swelling                         [name] Adult PT Treatment/Exercise - 08/08/14 0001    Ambulation/Gait   Gait Pattern Step-through pattern;Decreased step length - left  LT HIP ER in Hallway   Gait Comments Practiced heel-toe gait pattern and focused on decreasing ER of  LT hip   Exercises   Exercises Ankle   Manual Therapy   Passive ROM To LT ankle all motions with focus on EV, and DF with Pt sitting    Ankle Exercises: Stretches   Gastroc Stretch 5 reps;30 seconds   Ankle Exercises: Aerobic   Stationary Bike Nustep x 10 minutes.L4 with focus on good joint alignment   Ankle Exercises: Standing   Rocker Board 5 minutes   Heel Raises 20 reps;3 seconds   Toe Raise 20 reps   Other Standing Ankle Exercises Dyna disc LT ankle all directions 3x10                PT Education - 08/08/14 1057    Education provided Yes   Education Details Standing heel raises and calf stretching   Person(s) Educated Patient   Methods Explanation;Demonstration;Tactile cues;Verbal cues;Handout   Comprehension Verbalized understanding;Returned demonstration             PT Long Term Goals - 08/08/14 1045    PT LONG TERM GOAL #1   Title Ind with advanced HEP.   Status Achieved   PT LONG TERM GOAL #2   Title Increase dorsiflexion to 6- 8 degrees (with left knee in full extension) to normalize the patient's gait pattern   Time 6   Period Weeks  Status Achieved   PT LONG TERM GOAL #3   Title Increase left ankle strength to 5/5 to increase stability.   Time 6   Period Weeks   Status Achieved   PT LONG TERM GOAL #4   Title Walk without deviation a community distance with left ankle pain not > 2/10.  NM due to Pt still has a deviated gait pattern   Status Not Met               Plan - 08/08/14 1044    Clinical Impression Statement Pt did good with PT today and is Independent with HEP. She was able to meet 3/4 of her LTGs   Pt will benefit from skilled therapeutic intervention in order to improve on the following deficits Abnormal gait;Decreased range of motion;Difficulty walking;Increased edema;Decreased activity tolerance;Pain;Decreased mobility   Rehab Potential Excellent   PT Frequency 2x / week   PT Duration 6 weeks   PT Treatment/Interventions ADLs/Self Care Home Management;Therapeutic activities;Patient/family education;Therapeutic exercise;Passive range of motion;Manual  techniques;Cryotherapy;Neuromuscular re-education;Electrical Stimulation   PT Next Visit Plan DC Pt to HEP as per Pt and MD   Consulted and Agree with Plan of Care Patient        Problem List There are no active problems to display for this patient.   [name],CHRIS, PTA 08/08/2014, 12:50 PM  [facility] [address] [place], [place], [address] Phone: [phone]   Fax:  [phone]

## 2014-08-08 NOTE — Therapy (Addendum)
[facility] [place], [place], [address] Phone: [phone]   Fax:  [phone]  Physical Therapy Treatment  Patient Details  Name: Molly Hamilton MRN: [id number] Date of Birth: 06/15/1965 Referring Provider:  Marjean Donna, MD  Encounter Date: 08/08/2014      PT End of Session - 08/08/14 1043    Visit Number 8   Number of Visits 12   Date for PT Re-Evaluation 08/20/14   PT Start Time 1030   PT Stop Time 1120   PT Time Calculation (min) 50 min      Past Medical History  Diagnosis Date  . Glaucoma   . History of kidney stones     Past Surgical History  Procedure Laterality Date  . Abdominal hysterectomy    . Cesarean section      x2  . Tubal ligation    . Cystoscopy      with stone extraction  . Cholecystectomy N/A 05/10/2013    Procedure: LAPAROSCOPIC CHOLECYSTECTOMY;  Surgeon: Jamesetta So, MD;  Location: [facility];  Service: General;  Laterality: N/A;    There were no vitals filed for this visit.  Visit Diagnosis:  Left ankle pain  Ankle stiffness, left      Subjective Assessment - 08/08/14 1041    Subjective Did ok after last Rx. LT foot is mainly sore today. DC today   Limitations Walking   How long can you walk comfortably? 20 minutes.   Patient Stated Goals Walk without crutches at my daughter's wedding in May.   Pain Score 3    Pain Location Ankle   Pain Orientation Left   Pain Descriptors / Indicators Aching;Throbbing   Pain Onset More than a month ago   Aggravating Factors  swelling                         [name] Adult PT Treatment/Exercise - 08/08/14 0001    Ambulation/Gait   Gait Pattern Step-through pattern;Decreased step length - left  LT HIP ER in Hallway   Gait Comments Practiced heel-toe gait pattern and focused on decreasing ER of  LT hip   Exercises   Exercises Ankle   Manual Therapy   Passive ROM To LT ankle all motions with focus on EV, and DF with Pt sitting    Ankle Exercises: Stretches   Gastroc Stretch 5 reps;30 seconds   Ankle Exercises: Aerobic   Stationary Bike Nustep x 10 minutes.L4 with focus on good joint alignment   Ankle Exercises: Standing   Rocker Board 5 minutes   Heel Raises 20 reps;3 seconds   Toe Raise 20 reps   Other Standing Ankle Exercises Dyna disc LT ankle all directions 3x10                PT Education - 08/08/14 1057    Education provided Yes   Education Details Standing heel raises and calf stretching   Person(s) Educated Patient   Methods Explanation;Demonstration;Tactile cues;Verbal cues;Handout   Comprehension Verbalized understanding;Returned demonstration             PT Long Term Goals - 08/08/14 1045    PT LONG TERM GOAL #1   Title Ind with advanced HEP.   Status Achieved   PT LONG TERM GOAL #2   Title Increase dorsiflexion to 6- 8 degrees (with left knee in full extension) to normalize the patient's gait pattern   Time 6   Period Weeks  Status Achieved   PT LONG TERM GOAL #3   Title Increase left ankle strength to 5/5 to increase stability.   Time 6   Period Weeks   Status Achieved   PT LONG TERM GOAL #4   Title Walk without deviation a community distance with left ankle pain not > 2/10.  NM due to Pt still has a deviated gait pattern   Status Not Met               Plan - 08/08/14 1044    Clinical Impression Statement Pt did good with PT today and is Independent with HEP. She was able to meet 3/4 of her LTGs   Pt will benefit from skilled therapeutic intervention in order to improve on the following deficits Abnormal gait;Decreased range of motion;Difficulty walking;Increased edema;Decreased activity tolerance;Pain;Decreased mobility   Rehab Potential Excellent   PT Frequency 2x / week   PT Duration 6 weeks   PT Treatment/Interventions ADLs/Self Care Home Management;Therapeutic activities;Patient/family education;Therapeutic exercise;Passive range of motion;Manual  techniques;Cryotherapy;Neuromuscular re-education;Electrical Stimulation   PT Next Visit Plan DC Pt to HEP as per Pt and MD   Consulted and Agree with Plan of Care Patient     PHYSICAL THERAPY DISCHARGE SUMMARY  Visits from Start of Care: 8  Current functional level related to goals / functional outcomes: Please see above.   Remaining deficits: Goal #4 unmet.   Education / Equipment: HEP. Plan: Patient agrees to discharge.  Patient goals were partially met. Patient is being discharged due to meeting the stated rehab goals.  ?????        Problem List There are no active problems to display for this patient.   Mikah Rottinghaus, [place] MPT 08/08/2014, 1:17 PM  [facility] [address] [place], [place], [address] Phone: [phone]   Fax:  [phone]

## 2014-08-08 NOTE — Patient Instructions (Signed)
  Dorsiflexion: Resisted   Facing anchor, tubing around left foot, pull toward face.  Repeat _10___ times per set. Do __2__ sets per session. Do _2___ sessions per day.  http://orth.exer.us/8   Copyright  VHI. All rights reserved.  Plantar Flexion: Resisted   Anchor behind, tubing around left foot, press down. Repeat __10__ times per set. Do __2__ sets per session. Do ___2_ sessions per day.  http://orth.exer.us/10   Copyright  VHI. All rights reserved.  Inversion: Resisted   Cross legs with right leg underneath, foot in tubing loop. Hold tubing around other foot to resist and turn foot in. Repeat _10___ times per set. Do __2__ sets per session. Do _2___ sessions per day.  http://orth.exer.us/12   Copyright  VHI. All rights reserved.  Eversion: Resisted   With right foot in tubing loop, hold tubing around other foot to resist and turn foot out. Repeat _10___ times per set. Do __2__ sets per session. Do __2__ sessions per day.  http://orth.exer.us/14   Copyright  VHI. All rights reserved.   

## 2015-02-24 IMAGING — DX DG ANKLE COMPLETE 3+V*R*
3 series · 3 of 3 positions shown · non-contrast
Comparison: None.

CLINICAL DATA: Ankle pain after falling off a curb today

EXAM:
RIGHT ANKLE - COMPLETE 3+ VIEW

[ankle ap]
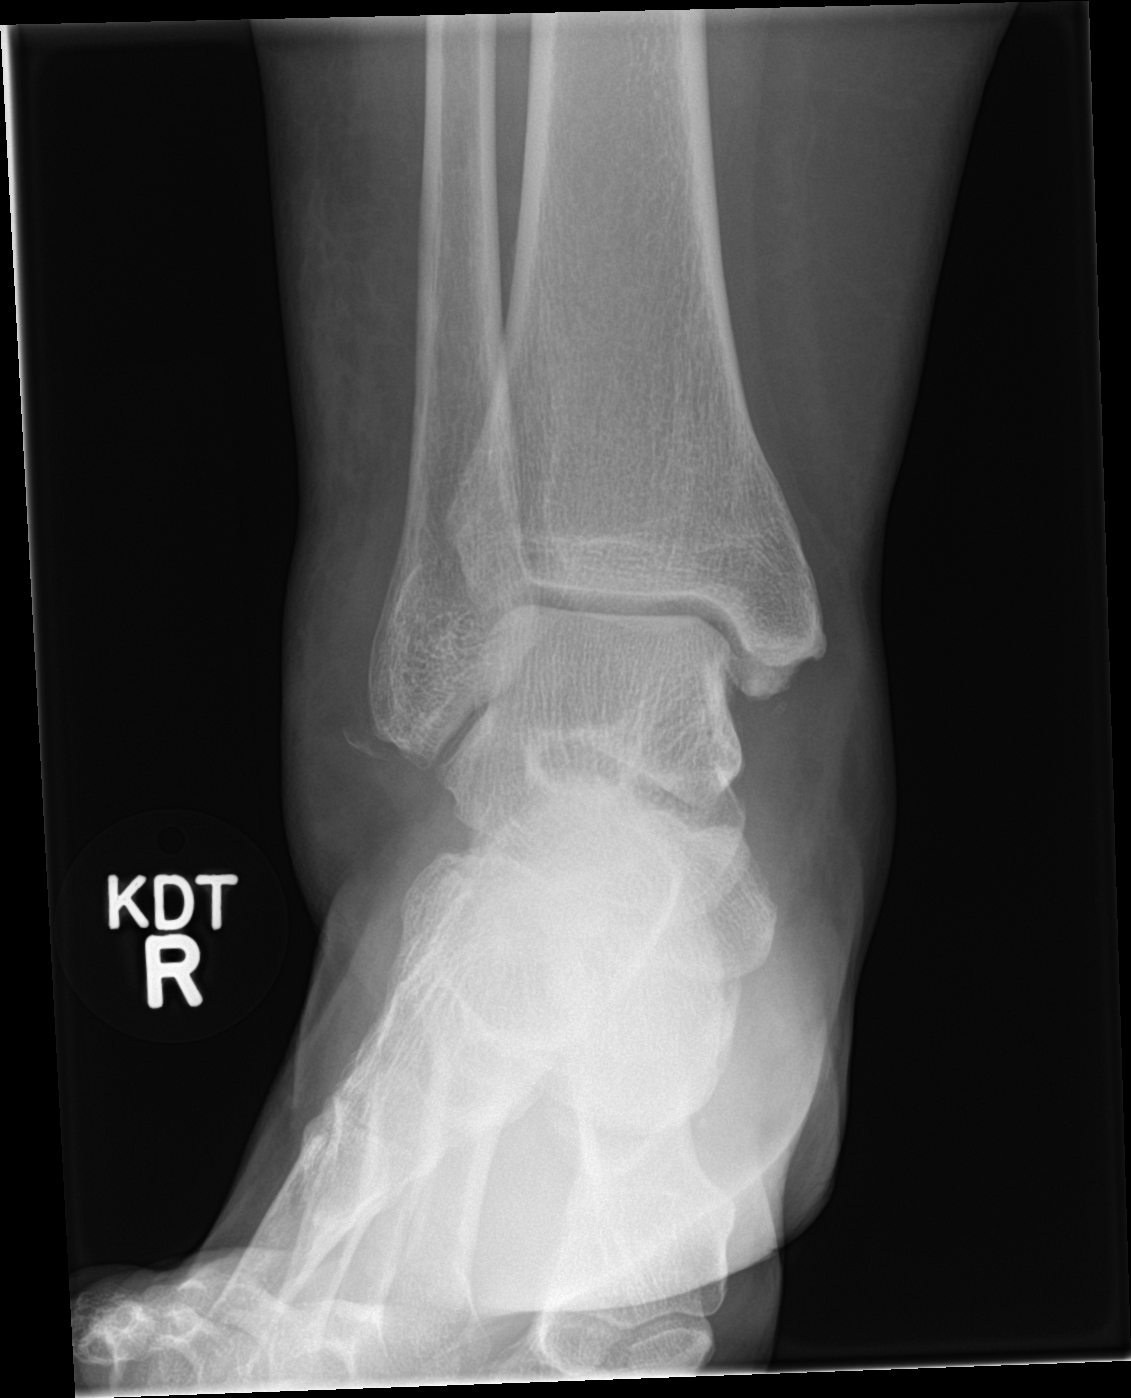

[ankle obl]
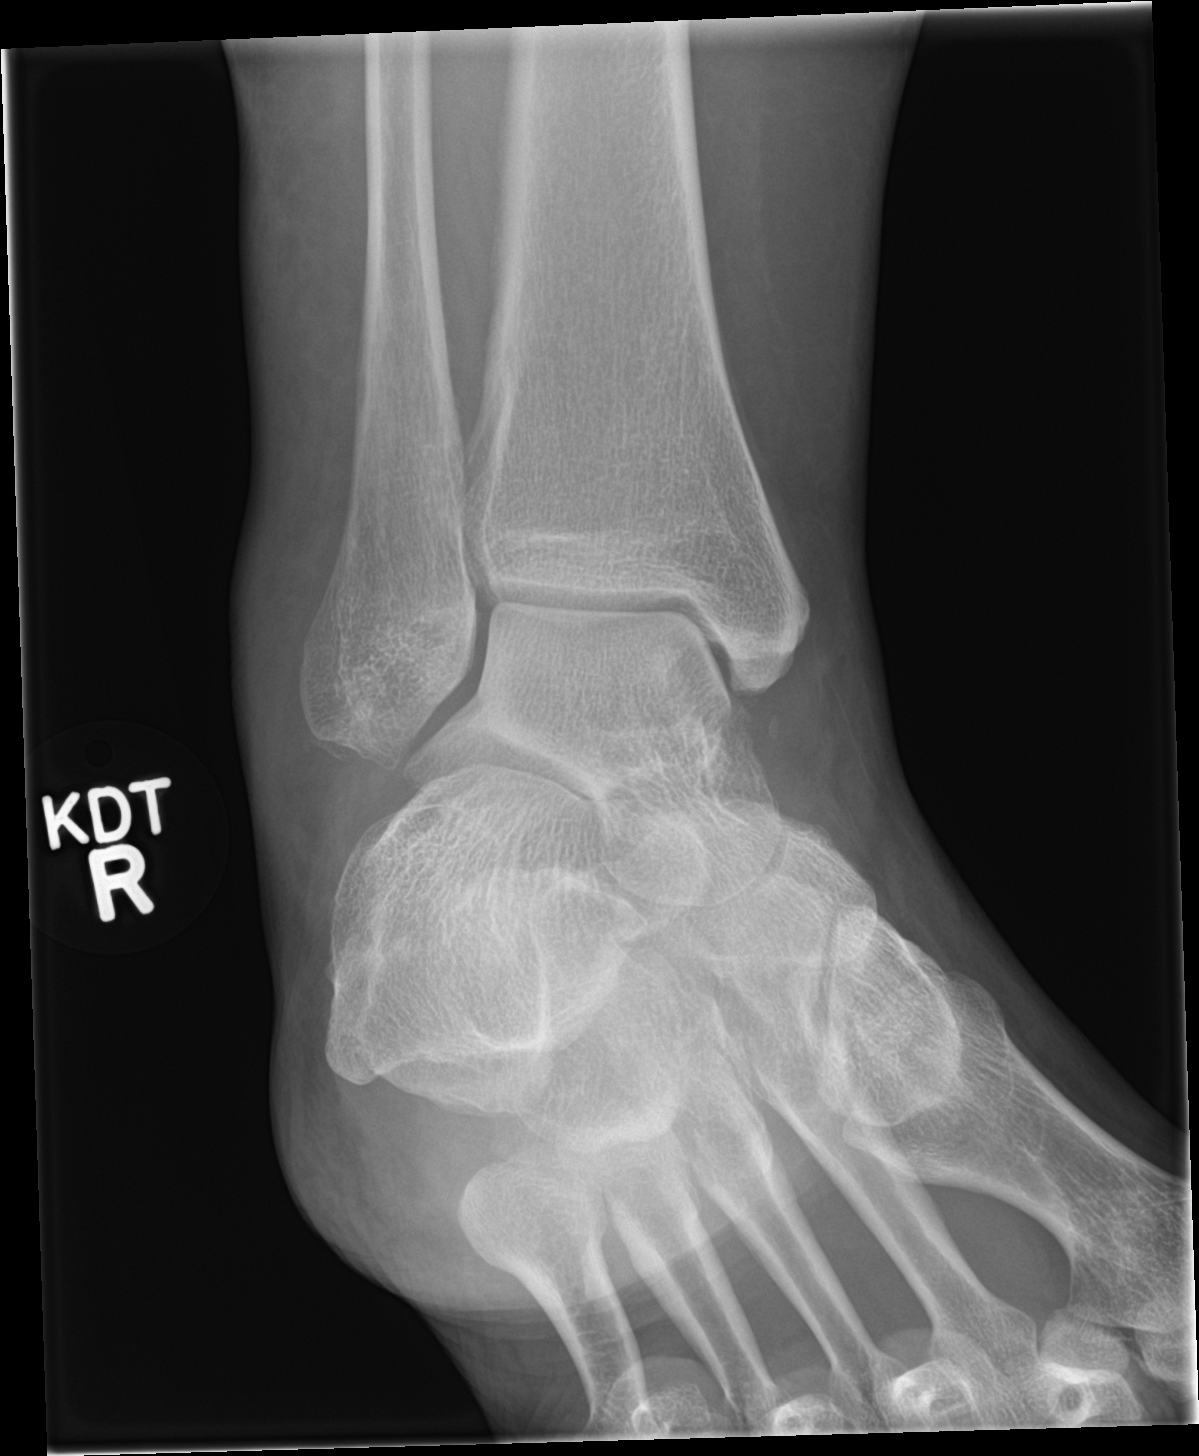

[ankle lat]
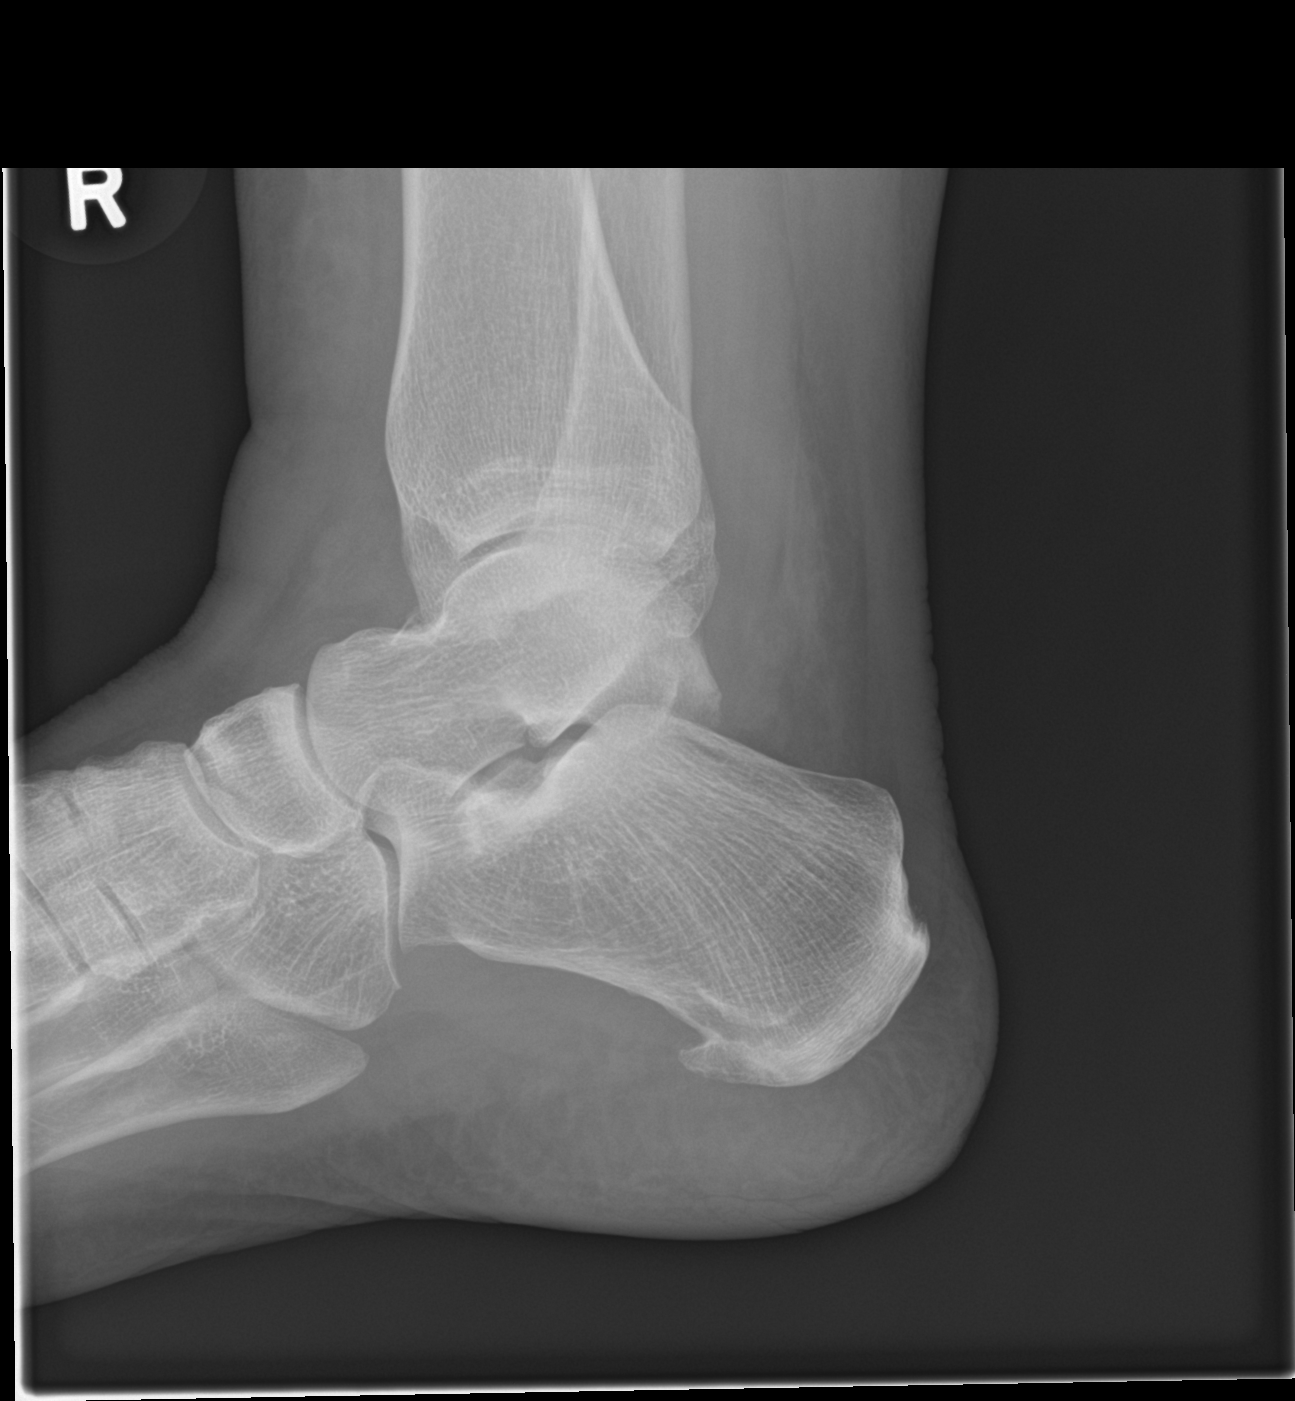

[3 of 3 positions shown; findings below may reference images not displayed]

FINDINGS: There is a mildly displaced fibular tip fracture with overlying
lateral malleolar soft tissue swelling. There are old-appearing
fragments at the medial malleolar tip. The mortise is symmetric.
IMPRESSION: Fibular tip fracture with intact appearances of the mortise.

## 2015-09-11 MED ORDER — CYCLOPENTOLATE-PHENYLEPHRINE OP SOLN OPTIME - NO CHARGE
OPHTHALMIC | Status: AC
  Filled 2015-09-11: qty 2

## 2015-09-11 MED ORDER — TETRACAINE HCL 0.5 % OP SOLN
OPHTHALMIC | Status: AC
  Filled 2015-09-11: qty 4

## 2015-09-11 MED ORDER — LIDOCAINE HCL 3.5 % OP GEL
OPHTHALMIC | Status: AC
  Filled 2015-09-11: qty 1

## 2015-09-14 ENCOUNTER — Ambulatory Visit (HOSPITAL_COMMUNITY)
Admission: RE | Admit: 2015-09-14 | Discharge: 2015-09-14 | Disposition: A | Discharge status: Home / Self Care | Payer: BLUE CROSS/BLUE SHIELD | Source: Ambulatory Visit | Attending: Ophthalmology | Admitting: Ophthalmology

## 2015-09-14 ENCOUNTER — Encounter (HOSPITAL_COMMUNITY)
Admission: RE | Disposition: A | Discharge status: Home / Self Care | Payer: Self-pay | Source: Ambulatory Visit | Attending: Ophthalmology

## 2015-09-14 ENCOUNTER — Encounter (HOSPITAL_COMMUNITY): Payer: Self-pay | Admitting: *Deleted

## 2015-09-14 DIAGNOSIS — H401122 Primary open-angle glaucoma, left eye, moderate stage: Secondary | ICD-10-CM | POA: Diagnosis not present

## 2015-09-14 DIAGNOSIS — H409 Unspecified glaucoma: Secondary | ICD-10-CM | POA: Diagnosis present

## 2015-09-14 HISTORY — PX: SLT LASER APPLICATION: SHX6099

## 2015-09-14 SURGERY — SLT LASER APPLICATION
Anesthesia: LOCAL | Laterality: Left

## 2015-09-14 MED ORDER — APRACLONIDINE HCL 1 % OP SOLN
OPHTHALMIC | Status: AC
  Filled 2015-09-14: qty 0.1

## 2015-09-14 MED ORDER — TETRACAINE HCL 0.5 % OP SOLN
1.0000 [drp] | OPHTHALMIC | Status: AC
  Administered 2015-09-14 (×3): 1 [drp] via OPHTHALMIC

## 2015-09-14 MED ORDER — HYPROMELLOSE (GONIOSCOPIC) 2.5 % OP SOLN
1.0000 [drp] | Freq: Once | OPHTHALMIC | Status: AC
  Administered 2015-09-14: 1 [drp] via OPHTHALMIC
  Filled 2015-09-14: qty 15

## 2015-09-14 MED ORDER — PILOCARPINE HCL 1 % OP SOLN
OPHTHALMIC | Status: AC
  Filled 2015-09-14: qty 15

## 2015-09-14 MED ORDER — PILOCARPINE HCL 1 % OP SOLN
1.0000 [drp] | Freq: Once | OPHTHALMIC | Status: AC
  Administered 2015-09-14: 1 [drp] via OPHTHALMIC

## 2015-09-14 MED ORDER — TETRACAINE HCL 0.5 % OP SOLN
OPHTHALMIC | Status: AC
  Filled 2015-09-14: qty 4

## 2015-09-14 MED ORDER — APRACLONIDINE HCL 1 % OP SOLN
1.0000 [drp] | OPHTHALMIC | Status: AC
  Administered 2015-09-14 (×3): 1 [drp] via OPHTHALMIC

## 2015-09-14 NOTE — Op Note (Signed)
None required

## 2015-09-14 NOTE — H&P (Signed)
I have reviewed the pre printed H&P, the patient was re-examined, and I have identified no significant interval changes in the patient's medical condition.  There is no change in the plan of care since the history and physical of record. 

## 2015-09-14 NOTE — Brief Op Note (Signed)
Molly DibbleMona B Hamilton 09/14/2015  Susa Simmondsarroll F Chelsei Mcchesney, MD  Pre-op Diagnosis:  uncontrolled glaucoma OS  Post-op Diagnosis: same  Yag laser self-test completed: Yes.    Indications:  See scanned office H&P for specific indications  Procedure:   SLT OS  Eye protection worn by staff:  Yes.   Laser In Use sign on door:  Yes.    Laser:  {LUMENIS YAG/SLT LASER  Power Setting:  0.7 mJ/burst Anatomical site treated:  Trabecular meshwork OS Number of applications:  92 Total energy delivered: 64.03 mJ Results:  Treated without complication, pt tolerated procedure well  The patient was discharged home with instructions to continue all her current glaucoma medications in the un-operated eye, and discontinue all her current glaucoma medications, if any.  Patient was instructed to go to the office, as previously scheduled, for intraocular pressure:  Yes.    Patient verbalizes understanding of discharge instructions:  Yes.    Notes:  Gomoscopy:  Grade:   4 Open    Pigmentation:  mild    Synechiae:  none    Angle ressessions : none

## 2015-09-14 NOTE — Discharge Instructions (Signed)
Molly DibbleMona B Hamilton  09/14/2015     Instructions    Activity: No Restrictions.   Diet: Resume Diet you were on at home.   Pain Medication: Tylenol if Needed.   CONTACT YOUR DOCTOR IF YOU HAVE PAIN, REDNESS IN YOUR EYE, OR DECREASED VISION.   Follow-up:                                  with Susa Simmondsarroll F Haines, MD.    Dr. Lita MainsHaines: [phone]    If you find that you cannot contact your physician, but feel that your signs and   Symptoms warrant a physician's attention, call the Emergency Room at   [phone].

## 2015-09-16 ENCOUNTER — Encounter (HOSPITAL_COMMUNITY): Payer: Self-pay | Admitting: Ophthalmology

## 2017-07-06 DIAGNOSIS — H401112 Primary open-angle glaucoma, right eye, moderate stage: Secondary | ICD-10-CM | POA: Diagnosis not present

## 2017-07-13 ENCOUNTER — Encounter (INDEPENDENT_AMBULATORY_CARE_PROVIDER_SITE_OTHER): Payer: Self-pay | Admitting: Orthopaedic Surgery

## 2017-07-13 ENCOUNTER — Ambulatory Visit (INDEPENDENT_AMBULATORY_CARE_PROVIDER_SITE_OTHER): Payer: BLUE CROSS/BLUE SHIELD | Admitting: Orthopaedic Surgery

## 2017-07-13 ENCOUNTER — Ambulatory Visit (INDEPENDENT_AMBULATORY_CARE_PROVIDER_SITE_OTHER): Payer: Self-pay

## 2017-07-13 VITALS — BP 98/77 | HR 96 | Ht 62.0 in | Wt 293.0 lb

## 2017-07-13 DIAGNOSIS — M79672 Pain in left foot: Secondary | ICD-10-CM | POA: Diagnosis not present

## 2017-07-13 DIAGNOSIS — S92355A Nondisplaced fracture of fifth metatarsal bone, left foot, initial encounter for closed fracture: Secondary | ICD-10-CM

## 2017-07-13 NOTE — Progress Notes (Signed)
Office Visit Note   Patient: Molly Hamilton           Date of Birth: 02-15-1966           MRN: [id number] Visit Date: 07/13/2017              Requested by: No referring provider defined for this encounter. PCP: Butch Penny, MD (Inactive)   Assessment & Plan: Visit Diagnoses:  1. Pain in left foot   2. Closed nondisplaced fracture of fifth metatarsal bone of left foot, initial encounter     Plan: Patient has a cam boot at home she will apply.  She can elevate it take some ibuprofen use some ice intermittently.  Continue use of the cane that she has.  We will obtain a bone density test since she is had several fractures and has a mother who has severe osteoporosis.  I plan to recheck her in 1 month.  Follow-Up Instructions: Return in about 1 month (around 08/13/2017).   Orders:  Orders Placed This Encounter  Procedures  . XR Foot Complete Left   No orders of the defined types were placed in this encounter.     Procedures: No procedures performed   Clinical Data: No additional findings.   Subjective: Chief Complaint  Patient presents with  . Left Foot - Pain    HPI 52 year old female with past history of recurrent left ankle injuries with some posttraumatic osteoarthritis with widening of the medial clear space on previous x-rays 3 years ago was walking across the yard rolled her ankle felt sharp pain a pop and had immediate pain over the proximal fifth metatarsal.  Date of injury was 07/10/2017.  She has a cam boot at home from previous injury.  She has had a fracture of the tip of the fibula in the distant past for her right ankle.  She has not had a bone density test but is had fractures of the left ankle, fracture on the right ankle, previous fifth metatarsal fracture and now the new fifth metatarsal fracture.  Previous ankle x-ray showed widening of the medial clear space consistent with deltoid ligament injury when she had fibular fracture.  Review of Systems positive  for previous ankle injuries, morbid obesity, hypertension increased cholesterol.  Otherwise negative as it pertains HPI.   Objective: Vital Signs: BP 98/77   Pulse 96   Ht  (1.575 m)   Wt 293 lb (132.9 kg)   BMI 53.59 kg/m   Physical Exam  Constitutional: She is oriented to person, place, and time. She appears well-developed.  HENT:  Head: Normocephalic.  Right Ear: External ear normal.  Left Ear: External ear normal.  Eyes: Pupils are equal, round, and reactive to light.  Neck: No tracheal deviation present. No thyromegaly present.  Cardiovascular: Normal rate.  Pulmonary/Chest: Effort normal.  Abdominal: Soft.  Neurological: She is alert and oriented to person, place, and time.  Skin: Skin is warm and dry.  Psychiatric: She has a normal mood and affect. Her behavior is normal.    Ortho Exam patient has mild tenderness of the anterior ankle and deltoid ligament.  Some discomfort with dorsiflexion.  She has acute tenderness over the proximal left fifth metatarsal.  No ecchymosis is noted.  Anterior talofibular ligament is normal to palpation.  1+ anterior drawer with firm endpoint.  Plantar fascia is normal pulses are normal.  Pain with resisted peroneal function.  Specialty Comments:  No specialty comments available.  Imaging: Xr Foot  Complete Left  Result Date: 07/13/2017 Three-view x-rays left foot demonstrates lateral cortex fracture of the fifth metatarsal.  There is no displacement of the fracture. Impression: Left proximal fifth metatarsal fracture extra-articular nondisplaced.    PMFS History: There are no active problems to display for this patient.  Past Medical History:  Diagnosis Date  . Glaucoma   . History of kidney stones     No family history on file.  Past Surgical History:  Procedure Laterality Date  . ABDOMINAL HYSTERECTOMY    . CESAREAN SECTION     x2  . CHOLECYSTECTOMY N/A 05/10/2013   Procedure: LAPAROSCOPIC CHOLECYSTECTOMY;  Surgeon:  Dalia Heading, MD;  Location: [facility];  Service: General;  Laterality: N/A;  . CYSTOSCOPY     with stone extraction  . SLT LASER APPLICATION Left 09/14/2015   Procedure: SLT LASER APPLICATION;  Surgeon: Susa Simmonds, MD;  Location: [facility];  Service: Ophthalmology;  Laterality: Left;  . TUBAL LIGATION     Social History   Occupational History  . Not on file  Tobacco Use  . Smoking status: Former Smoker    Packs/day: 0.25    Years: 4.00    Pack years: 1.00    Types: Cigarettes    Last attempt to quit: 05/07/1981    Years since quitting: 36.2  . Smokeless tobacco: Never Used  Substance and Sexual Activity  . Alcohol use: No  . Drug use: No  . Sexual activity: Yes    Birth control/protection: Surgical

## 2017-07-18 ENCOUNTER — Encounter: Payer: Self-pay | Admitting: Orthopaedic Surgery

## 2017-07-18 DIAGNOSIS — M8588 Other specified disorders of bone density and structure, other site: Secondary | ICD-10-CM | POA: Diagnosis not present

## 2017-07-18 DIAGNOSIS — M810 Age-related osteoporosis without current pathological fracture: Secondary | ICD-10-CM | POA: Diagnosis not present

## 2017-07-18 DIAGNOSIS — Z780 Asymptomatic menopausal state: Secondary | ICD-10-CM | POA: Diagnosis not present

## 2017-07-18 DIAGNOSIS — Z90711 Acquired absence of uterus with remaining cervical stump: Secondary | ICD-10-CM | POA: Diagnosis not present

## 2017-08-06 DIAGNOSIS — J029 Acute pharyngitis, unspecified: Secondary | ICD-10-CM | POA: Diagnosis not present

## 2017-08-06 DIAGNOSIS — R05 Cough: Secondary | ICD-10-CM | POA: Diagnosis not present

## 2017-08-06 DIAGNOSIS — Z6841 Body Mass Index (BMI) 40.0 and over, adult: Secondary | ICD-10-CM | POA: Diagnosis not present

## 2017-08-17 ENCOUNTER — Ambulatory Visit (INDEPENDENT_AMBULATORY_CARE_PROVIDER_SITE_OTHER): Payer: BLUE CROSS/BLUE SHIELD | Admitting: Orthopaedic Surgery

## 2017-08-31 ENCOUNTER — Ambulatory Visit (INDEPENDENT_AMBULATORY_CARE_PROVIDER_SITE_OTHER): Payer: Self-pay

## 2017-08-31 ENCOUNTER — Encounter (INDEPENDENT_AMBULATORY_CARE_PROVIDER_SITE_OTHER): Payer: Self-pay | Admitting: Orthopaedic Surgery

## 2017-08-31 ENCOUNTER — Ambulatory Visit (INDEPENDENT_AMBULATORY_CARE_PROVIDER_SITE_OTHER): Payer: BLUE CROSS/BLUE SHIELD | Admitting: Orthopaedic Surgery

## 2017-08-31 VITALS — BP 141/80 | HR 73 | Ht 62.0 in | Wt 293.0 lb

## 2017-08-31 DIAGNOSIS — S92355A Nondisplaced fracture of fifth metatarsal bone, left foot, initial encounter for closed fracture: Secondary | ICD-10-CM

## 2017-08-31 NOTE — Progress Notes (Signed)
Office Visit Note   Patient: Molly Hamilton           Date of Birth: [birth year]           MRN: [id number] Visit Date: 08/31/2017              Requested by: No referring provider defined for this encounter. PCP: Butch Penny, MD (Inactive)   Assessment & Plan: Visit Diagnoses:  1. Closed nondisplaced fracture of fifth metatarsal bone of left foot, initial encounter     Plan: Patient's left fifth metatarsal fracture is healing by radiograph.  Bone density test results were reviewed.  We discussed vitamin D, calcium usage.  Once her foot is asymptomatic she can slowly very gradually resume a walking program and we discussed the importance of very slow ramp up gradually work her way up to walking 1 to 2 miles per day to help with her bone density.  She will return if she has persistent problems.  Exercise in the pool to help with weight loss was discussed in detail although she also understands that this will not improve her bone density like ambulation can.  Follow-Up Instructions: Return if symptoms worsen or fail to improve.   Orders:  Orders Placed This Encounter  Procedures  . XR Foot Complete Left   No orders of the defined types were placed in this encounter.     Procedures: No procedures performed   Clinical Data: No additional findings.   Subjective: Chief Complaint  Patient presents with  . Left Foot - Fracture, Follow-up    HPI 52 year old female returns for follow-up 1 month of left fifth metatarsal fracture.  She is using the Cam boot she has some soreness and some pain intermittently but it is better than it was last month.  Bone density has been performed which shows findings consistent with osteopenia.  Femoral neck lumbar spine and radius were reviewed.  Review of Systems review of systems updated unchanged from 07/13/2017.  Of note is morbid obesity and previous ankle fracture which is healed with some widening of the medial clear space consistent with a  deltoid ligament injury when she had the fibular fracture.   Objective: Vital Signs: BP (!) 141/80   Pulse 73   Ht 5\' 2"  (1.575 m)   Wt 293 lb (132.9 kg)   BMI 53.59 kg/m   Physical Exam  Constitutional: She is oriented to person, place, and time. She appears well-developed.  HENT:  Head: Normocephalic.  Right Ear: External ear normal.  Left Ear: External ear normal.  Eyes: Pupils are equal, round, and reactive to light.  Neck: No tracheal deviation present. No thyromegaly present.  Cardiovascular: Normal rate.  Pulmonary/Chest: Effort normal.  Abdominal: Soft.  Neurological: She is alert and oriented to person, place, and time.  Skin: Skin is warm and dry.  Psychiatric: She has a normal mood and affect. Her behavior is normal.    Ortho Exam patient has some tenderness of the anterior ankle and over the deltoid ligament.  Mild tenderness over the left proximal metatarsal.  Specialty Comments:  No specialty comments available.  Imaging: Three-view x-rays left foot demonstrates healing of the left proximal fifth metatarsal fracture.  Impression: Healing left fifth metatarsal fracture without displacement.   PMFS History: Patient Active Problem List   Diagnosis Date Noted  . Closed nondisplaced fracture of fifth left metatarsal bone 07/13/2017   Past Medical History:  Diagnosis Date  . Glaucoma   . History of kidney  stones     No family history on file.  Past Surgical History:  Procedure Laterality Date  . ABDOMINAL HYSTERECTOMY    . CESAREAN SECTION     x2  . CHOLECYSTECTOMY N/A 05/10/2013   Procedure: LAPAROSCOPIC CHOLECYSTECTOMY;  Surgeon: Dalia HeadingMark A Jenkins, MD;  Location: [facility];  Service: General;  Laterality: N/A;  . CYSTOSCOPY     with stone extraction  . SLT LASER APPLICATION Left 09/14/2015   Procedure: SLT LASER APPLICATION;  Surgeon: Susa Simmondsarroll F Haines, MD;  Location: [facility];  Service: Ophthalmology;  Laterality: Left;  . TUBAL LIGATION     Social History    Occupational History  . Not on file  Tobacco Use  . Smoking status: Former Smoker    Packs/day: 0.25    Years: 4.00    Pack years: 1.00    Types: Cigarettes    Last attempt to quit: 05/07/1981    Years since quitting: 36.3  . Smokeless tobacco: Never Used  Substance and Sexual Activity  . Alcohol use: No  . Drug use: No  . Sexual activity: Yes    Birth control/protection: Surgical

## 2017-09-04 ENCOUNTER — Encounter (INDEPENDENT_AMBULATORY_CARE_PROVIDER_SITE_OTHER): Payer: Self-pay | Admitting: Orthopaedic Surgery

## 2017-10-31 DIAGNOSIS — H401123 Primary open-angle glaucoma, left eye, severe stage: Secondary | ICD-10-CM | POA: Diagnosis not present

## 2017-11-21 DIAGNOSIS — H401123 Primary open-angle glaucoma, left eye, severe stage: Secondary | ICD-10-CM | POA: Diagnosis not present

## 2018-04-17 DIAGNOSIS — H401123 Primary open-angle glaucoma, left eye, severe stage: Secondary | ICD-10-CM | POA: Diagnosis not present

## 2018-08-20 DIAGNOSIS — H401112 Primary open-angle glaucoma, right eye, moderate stage: Secondary | ICD-10-CM | POA: Diagnosis not present

## 2019-01-01 DIAGNOSIS — M7732 Calcaneal spur, left foot: Secondary | ICD-10-CM | POA: Diagnosis not present

## 2019-01-01 DIAGNOSIS — M19072 Primary osteoarthritis, left ankle and foot: Secondary | ICD-10-CM | POA: Diagnosis not present

## 2019-01-01 DIAGNOSIS — M79672 Pain in left foot: Secondary | ICD-10-CM | POA: Diagnosis not present

## 2019-01-01 DIAGNOSIS — M65872 Other synovitis and tenosynovitis, left ankle and foot: Secondary | ICD-10-CM | POA: Diagnosis not present

## 2019-01-01 DIAGNOSIS — M25572 Pain in left ankle and joints of left foot: Secondary | ICD-10-CM | POA: Diagnosis not present

## 2019-01-16 DIAGNOSIS — H401130 Primary open-angle glaucoma, bilateral, stage unspecified: Secondary | ICD-10-CM | POA: Diagnosis not present

## 2019-01-16 DIAGNOSIS — M25572 Pain in left ankle and joints of left foot: Secondary | ICD-10-CM | POA: Diagnosis not present

## 2019-01-16 DIAGNOSIS — Z0189 Encounter for other specified special examinations: Secondary | ICD-10-CM | POA: Diagnosis not present

## 2019-01-22 DIAGNOSIS — M25572 Pain in left ankle and joints of left foot: Secondary | ICD-10-CM | POA: Diagnosis not present

## 2019-01-22 DIAGNOSIS — M65872 Other synovitis and tenosynovitis, left ankle and foot: Secondary | ICD-10-CM | POA: Diagnosis not present

## 2019-01-28 DIAGNOSIS — E785 Hyperlipidemia, unspecified: Secondary | ICD-10-CM | POA: Diagnosis not present

## 2019-01-28 DIAGNOSIS — Z1329 Encounter for screening for other suspected endocrine disorder: Secondary | ICD-10-CM | POA: Diagnosis not present

## 2019-01-28 DIAGNOSIS — Z0000 Encounter for general adult medical examination without abnormal findings: Secondary | ICD-10-CM | POA: Diagnosis not present

## 2019-01-28 DIAGNOSIS — Z1321 Encounter for screening for nutritional disorder: Secondary | ICD-10-CM | POA: Diagnosis not present

## 2019-01-30 DIAGNOSIS — E785 Hyperlipidemia, unspecified: Secondary | ICD-10-CM | POA: Diagnosis not present

## 2019-01-30 DIAGNOSIS — H401130 Primary open-angle glaucoma, bilateral, stage unspecified: Secondary | ICD-10-CM | POA: Diagnosis not present

## 2019-01-30 DIAGNOSIS — E039 Hypothyroidism, unspecified: Secondary | ICD-10-CM | POA: Diagnosis not present

## 2019-01-30 DIAGNOSIS — M25572 Pain in left ankle and joints of left foot: Secondary | ICD-10-CM | POA: Diagnosis not present

## 2019-02-05 DIAGNOSIS — M65872 Other synovitis and tenosynovitis, left ankle and foot: Secondary | ICD-10-CM | POA: Diagnosis not present

## 2019-02-05 DIAGNOSIS — M25572 Pain in left ankle and joints of left foot: Secondary | ICD-10-CM | POA: Diagnosis not present

## 2019-03-01 DIAGNOSIS — Z1329 Encounter for screening for other suspected endocrine disorder: Secondary | ICD-10-CM | POA: Diagnosis not present

## 2019-03-06 DIAGNOSIS — E039 Hypothyroidism, unspecified: Secondary | ICD-10-CM | POA: Diagnosis not present

## 2019-04-05 DIAGNOSIS — H401112 Primary open-angle glaucoma, right eye, moderate stage: Secondary | ICD-10-CM | POA: Diagnosis not present

## 2019-04-12 DIAGNOSIS — R946 Abnormal results of thyroid function studies: Secondary | ICD-10-CM | POA: Diagnosis not present

## 2019-04-19 DIAGNOSIS — Z23 Encounter for immunization: Secondary | ICD-10-CM | POA: Diagnosis not present

## 2019-04-19 DIAGNOSIS — F411 Generalized anxiety disorder: Secondary | ICD-10-CM | POA: Diagnosis not present

## 2019-04-19 DIAGNOSIS — E039 Hypothyroidism, unspecified: Secondary | ICD-10-CM | POA: Diagnosis not present

## 2019-04-25 DIAGNOSIS — M71571 Other bursitis, not elsewhere classified, right ankle and foot: Secondary | ICD-10-CM | POA: Diagnosis not present

## 2019-04-25 DIAGNOSIS — M722 Plantar fascial fibromatosis: Secondary | ICD-10-CM | POA: Diagnosis not present

## 2019-04-25 DIAGNOSIS — M7731 Calcaneal spur, right foot: Secondary | ICD-10-CM | POA: Diagnosis not present

## 2019-08-02 DIAGNOSIS — E039 Hypothyroidism, unspecified: Secondary | ICD-10-CM | POA: Diagnosis not present

## 2019-08-02 DIAGNOSIS — R7301 Impaired fasting glucose: Secondary | ICD-10-CM | POA: Diagnosis not present

## 2019-08-02 DIAGNOSIS — F411 Generalized anxiety disorder: Secondary | ICD-10-CM | POA: Diagnosis not present

## 2019-08-02 DIAGNOSIS — Z0000 Encounter for general adult medical examination without abnormal findings: Secondary | ICD-10-CM | POA: Diagnosis not present

## 2019-08-09 ENCOUNTER — Other Ambulatory Visit (HOSPITAL_COMMUNITY): Payer: Self-pay | Admitting: Internal Medicine

## 2019-08-09 DIAGNOSIS — E039 Hypothyroidism, unspecified: Secondary | ICD-10-CM | POA: Diagnosis not present

## 2019-08-09 DIAGNOSIS — E785 Hyperlipidemia, unspecified: Secondary | ICD-10-CM | POA: Diagnosis not present

## 2019-08-09 DIAGNOSIS — Z1231 Encounter for screening mammogram for malignant neoplasm of breast: Secondary | ICD-10-CM

## 2019-08-09 DIAGNOSIS — H401130 Primary open-angle glaucoma, bilateral, stage unspecified: Secondary | ICD-10-CM | POA: Diagnosis not present

## 2019-08-09 DIAGNOSIS — M25572 Pain in left ankle and joints of left foot: Secondary | ICD-10-CM | POA: Diagnosis not present

## 2019-08-21 ENCOUNTER — Ambulatory Visit (HOSPITAL_COMMUNITY)
Admission: RE | Admit: 2019-08-21 | Discharge: 2019-08-21 | Disposition: A | Discharge status: Home / Self Care | Payer: BC Managed Care – PPO | Source: Ambulatory Visit | Attending: Internal Medicine | Admitting: Internal Medicine

## 2019-08-21 ENCOUNTER — Other Ambulatory Visit: Payer: Self-pay

## 2019-08-21 DIAGNOSIS — Z1231 Encounter for screening mammogram for malignant neoplasm of breast: Secondary | ICD-10-CM | POA: Diagnosis not present

## 2019-10-02 DIAGNOSIS — E785 Hyperlipidemia, unspecified: Secondary | ICD-10-CM | POA: Diagnosis not present

## 2019-10-02 DIAGNOSIS — M25572 Pain in left ankle and joints of left foot: Secondary | ICD-10-CM | POA: Diagnosis not present

## 2019-10-02 DIAGNOSIS — H401130 Primary open-angle glaucoma, bilateral, stage unspecified: Secondary | ICD-10-CM | POA: Diagnosis not present

## 2019-10-02 DIAGNOSIS — E559 Vitamin D deficiency, unspecified: Secondary | ICD-10-CM | POA: Diagnosis not present

## 2019-10-02 DIAGNOSIS — N390 Urinary tract infection, site not specified: Secondary | ICD-10-CM | POA: Diagnosis not present

## 2019-10-18 DIAGNOSIS — H401123 Primary open-angle glaucoma, left eye, severe stage: Secondary | ICD-10-CM | POA: Diagnosis not present

## 2019-12-31 ENCOUNTER — Other Ambulatory Visit: Payer: Self-pay | Admitting: Gastroenterology

## 2019-12-31 DIAGNOSIS — Z800 Family history of malignant neoplasm of digestive organs: Secondary | ICD-10-CM | POA: Diagnosis not present

## 2019-12-31 DIAGNOSIS — K219 Gastro-esophageal reflux disease without esophagitis: Secondary | ICD-10-CM | POA: Diagnosis not present

## 2020-01-23 ENCOUNTER — Other Ambulatory Visit: Payer: Self-pay

## 2020-01-23 ENCOUNTER — Encounter (HOSPITAL_COMMUNITY): Payer: Self-pay | Admitting: Gastroenterology

## 2020-01-30 ENCOUNTER — Other Ambulatory Visit (HOSPITAL_COMMUNITY): Payer: BC Managed Care – PPO

## 2020-02-03 ENCOUNTER — Ambulatory Visit (HOSPITAL_COMMUNITY)
Admission: RE | Admit: 2020-02-03 | Payer: BC Managed Care – PPO | Source: Home / Self Care | Admitting: Gastroenterology

## 2020-02-03 HISTORY — DX: Hypothyroidism, unspecified: E03.9

## 2020-02-03 SURGERY — COLONOSCOPY WITH PROPOFOL
Anesthesia: Monitor Anesthesia Care

## 2020-02-18 DIAGNOSIS — E785 Hyperlipidemia, unspecified: Secondary | ICD-10-CM | POA: Diagnosis not present

## 2020-02-18 DIAGNOSIS — N390 Urinary tract infection, site not specified: Secondary | ICD-10-CM | POA: Diagnosis not present

## 2020-02-18 DIAGNOSIS — E782 Mixed hyperlipidemia: Secondary | ICD-10-CM | POA: Diagnosis not present

## 2020-02-18 DIAGNOSIS — E559 Vitamin D deficiency, unspecified: Secondary | ICD-10-CM | POA: Diagnosis not present

## 2020-02-18 DIAGNOSIS — F411 Generalized anxiety disorder: Secondary | ICD-10-CM | POA: Diagnosis not present

## 2020-02-18 DIAGNOSIS — M25572 Pain in left ankle and joints of left foot: Secondary | ICD-10-CM | POA: Diagnosis not present

## 2020-02-18 DIAGNOSIS — E039 Hypothyroidism, unspecified: Secondary | ICD-10-CM | POA: Diagnosis not present

## 2020-02-18 DIAGNOSIS — H401130 Primary open-angle glaucoma, bilateral, stage unspecified: Secondary | ICD-10-CM | POA: Diagnosis not present

## 2020-02-28 DIAGNOSIS — M25572 Pain in left ankle and joints of left foot: Secondary | ICD-10-CM | POA: Diagnosis not present

## 2020-02-28 DIAGNOSIS — E785 Hyperlipidemia, unspecified: Secondary | ICD-10-CM | POA: Diagnosis not present

## 2020-02-28 DIAGNOSIS — H401130 Primary open-angle glaucoma, bilateral, stage unspecified: Secondary | ICD-10-CM | POA: Diagnosis not present

## 2020-02-28 DIAGNOSIS — Z23 Encounter for immunization: Secondary | ICD-10-CM | POA: Diagnosis not present

## 2020-03-25 DIAGNOSIS — F411 Generalized anxiety disorder: Secondary | ICD-10-CM | POA: Diagnosis not present

## 2020-05-01 DIAGNOSIS — H401112 Primary open-angle glaucoma, right eye, moderate stage: Secondary | ICD-10-CM | POA: Diagnosis not present

## 2020-07-10 DIAGNOSIS — H401111 Primary open-angle glaucoma, right eye, mild stage: Secondary | ICD-10-CM | POA: Diagnosis not present

## 2020-07-30 DIAGNOSIS — J069 Acute upper respiratory infection, unspecified: Secondary | ICD-10-CM | POA: Diagnosis not present

## 2020-07-30 DIAGNOSIS — R059 Cough, unspecified: Secondary | ICD-10-CM | POA: Diagnosis not present

## 2020-08-20 DIAGNOSIS — E039 Hypothyroidism, unspecified: Secondary | ICD-10-CM | POA: Diagnosis not present

## 2020-08-20 DIAGNOSIS — E785 Hyperlipidemia, unspecified: Secondary | ICD-10-CM | POA: Diagnosis not present

## 2020-08-20 DIAGNOSIS — E559 Vitamin D deficiency, unspecified: Secondary | ICD-10-CM | POA: Diagnosis not present

## 2020-08-27 DIAGNOSIS — H401130 Primary open-angle glaucoma, bilateral, stage unspecified: Secondary | ICD-10-CM | POA: Diagnosis not present

## 2020-08-27 DIAGNOSIS — F411 Generalized anxiety disorder: Secondary | ICD-10-CM | POA: Diagnosis not present

## 2020-08-27 DIAGNOSIS — E782 Mixed hyperlipidemia: Secondary | ICD-10-CM | POA: Diagnosis not present

## 2020-08-27 DIAGNOSIS — E039 Hypothyroidism, unspecified: Secondary | ICD-10-CM | POA: Diagnosis not present

## 2020-09-07 ENCOUNTER — Other Ambulatory Visit (HOSPITAL_COMMUNITY): Payer: Self-pay | Admitting: Internal Medicine

## 2020-09-07 DIAGNOSIS — Z1231 Encounter for screening mammogram for malignant neoplasm of breast: Secondary | ICD-10-CM

## 2020-09-21 ENCOUNTER — Ambulatory Visit (HOSPITAL_COMMUNITY): Payer: BC Managed Care – PPO

## 2020-09-23 DIAGNOSIS — L281 Prurigo nodularis: Secondary | ICD-10-CM | POA: Diagnosis not present

## 2020-09-23 DIAGNOSIS — L821 Other seborrheic keratosis: Secondary | ICD-10-CM | POA: Diagnosis not present

## 2020-09-28 ENCOUNTER — Other Ambulatory Visit: Payer: Self-pay

## 2020-09-28 ENCOUNTER — Ambulatory Visit (HOSPITAL_COMMUNITY)
Admission: RE | Admit: 2020-09-28 | Discharge: 2020-09-28 | Disposition: A | Discharge status: Home / Self Care | Payer: BC Managed Care – PPO | Source: Ambulatory Visit | Attending: Internal Medicine | Admitting: Internal Medicine

## 2020-09-28 DIAGNOSIS — Z1231 Encounter for screening mammogram for malignant neoplasm of breast: Secondary | ICD-10-CM

## 2020-10-01 ENCOUNTER — Encounter: Payer: Self-pay | Admitting: Internal Medicine

## 2020-12-15 ENCOUNTER — Encounter: Payer: Self-pay | Admitting: Gastroenterology

## 2021-01-29 ENCOUNTER — Ambulatory Visit: Payer: BC Managed Care – PPO | Admitting: Gastroenterology

## 2021-02-22 DIAGNOSIS — E782 Mixed hyperlipidemia: Secondary | ICD-10-CM | POA: Diagnosis not present

## 2021-02-22 DIAGNOSIS — E039 Hypothyroidism, unspecified: Secondary | ICD-10-CM | POA: Diagnosis not present

## 2021-03-03 DIAGNOSIS — E559 Vitamin D deficiency, unspecified: Secondary | ICD-10-CM | POA: Diagnosis not present

## 2021-03-03 DIAGNOSIS — E039 Hypothyroidism, unspecified: Secondary | ICD-10-CM | POA: Diagnosis not present

## 2021-03-03 DIAGNOSIS — H401130 Primary open-angle glaucoma, bilateral, stage unspecified: Secondary | ICD-10-CM | POA: Diagnosis not present

## 2021-03-03 DIAGNOSIS — E782 Mixed hyperlipidemia: Secondary | ICD-10-CM | POA: Diagnosis not present

## 2021-05-05 ENCOUNTER — Other Ambulatory Visit: Payer: Self-pay

## 2021-05-05 ENCOUNTER — Encounter: Payer: Self-pay | Admitting: Gastroenterology

## 2021-05-05 ENCOUNTER — Telehealth: Payer: Self-pay | Admitting: *Deleted

## 2021-05-05 ENCOUNTER — Ambulatory Visit: Payer: BC Managed Care – PPO | Admitting: Gastroenterology

## 2021-05-05 ENCOUNTER — Encounter: Payer: Self-pay | Admitting: *Deleted

## 2021-05-05 DIAGNOSIS — Z1211 Encounter for screening for malignant neoplasm of colon: Secondary | ICD-10-CM

## 2021-05-05 DIAGNOSIS — Z8371 Family history of colonic polyps: Secondary | ICD-10-CM

## 2021-05-05 DIAGNOSIS — Z800 Family history of malignant neoplasm of digestive organs: Secondary | ICD-10-CM

## 2021-05-05 MED ORDER — PEG 3350-KCL-NA BICARB-NACL 420 G PO SOLR: ORAL | 0 refills | Status: DC

## 2021-05-05 NOTE — Patient Instructions (Signed)
Colonoscopy to be scheduled.  Please see separate instructions. 

## 2021-05-05 NOTE — H&P (View-Only) (Signed)
Primary Care Physician:  Valentino Nose, FNP  Primary Gastroenterologist:  Elon Alas. Abbey Chatters, DO   Chief Complaint  Patient presents with   Colonoscopy    Never had tcs. Father, great grandfather, and aunt had colon cancer. Not having any problems.    HPI:  Molly Hamilton is a 56 y.o. female here at the request of Ulyses Southward, FNP to schedule colonoscopy.  Patient has never had a colonoscopy.  Her family history is significant for colon cancer and colon polyps.  Her father, paternal great grandfather, paternal aunt all had colon cancer.  Her aunt was less than 85 when diagnosed.  Her brother has had adenomatous colon polyps, first found in his 74s.  Patient denies any issues with constipation or diarrhea.  No melena or rectal bleeding.  She has occasional heartburn/regurgitation that occurs at night sometimes.  She takes over-the-counter antacids with relief.  Denies dysphagia, vomiting.  She has occasional gas pain but no significant abdominal pain otherwise.   Current Outpatient Medications  Medication Sig Dispense Refill   atorvastatin (LIPITOR) 10 MG tablet Take 10 mg by mouth daily.     dorzolamide-timolol (COSOPT) 22.3-6.8 MG/ML ophthalmic solution 2 (two) times daily.     latanoprost (XALATAN) 0.005 % ophthalmic solution Place 1 drop into both eyes at bedtime.     levothyroxine (SYNTHROID) 75 MCG tablet Take 75 mcg by mouth daily before breakfast.     PARoxetine (PAXIL) 20 MG tablet Take 1 tablet by mouth daily. Can takes 2nd dose if needed.     No current facility-administered medications for this visit.    Allergies as of 05/05/2021 - Review Complete 05/05/2021  Allergen Reaction Noted   Codeine Nausea And Vomiting 05/07/2013   Sulfa antibiotics Nausea And Vomiting 05/07/2013    Past Medical History:  Diagnosis Date   Glaucoma    History of kidney stones    Hyperlipidemia    Hypothyroidism    Morbid obesity with BMI of 50.0-59.9, adult [facility])     Past Surgical  History:  Procedure Laterality Date   ABDOMINAL HYSTERECTOMY     CESAREAN SECTION     x2   CHOLECYSTECTOMY N/A 05/10/2013   Procedure: LAPAROSCOPIC CHOLECYSTECTOMY;  Surgeon: Jamesetta So, MD;  Location: [facility];  Service: General;  Laterality: N/A;   CYSTOSCOPY     with stone extraction   SLT LASER APPLICATION Left 0000000   Procedure: SLT LASER APPLICATION;  Surgeon: Williams Che, MD;  Location: [facility];  Service: Ophthalmology;  Laterality: Left;   TUBAL LIGATION      Family History  Problem Relation Age of Onset   Colon cancer Father 77       advanced when found.   Colon polyps Brother        70s   Colon cancer Paternal Aunt 38       lived 13 years but died from disease   Colon cancer Other        great grandfather on father's side had colon cancer    Social History   Socioeconomic History   Marital status: Married    Spouse name: Not on file   Number of children: Not on file   Years of education: Not on file   Highest education level: Not on file  Occupational History   Not on file  Tobacco Use   Smoking status: Former    Packs/day: 0.25    Years: 4.00    Pack years: 1.00    Types:  Cigarettes    Quit date: 05/07/1981    Years since quitting: 40.0   Smokeless tobacco: Never  Substance and Sexual Activity   Alcohol use: No   Drug use: No   Sexual activity: Yes    Birth control/protection: Surgical  Other Topics Concern   Not on file  Social History Narrative   Not on file   Social Determinants of Health   Financial Resource Strain: Not on file  Food Insecurity: Not on file  Transportation Needs: Not on file  Physical Activity: Not on file  Stress: Not on file  Social Connections: Not on file  Intimate Partner Violence: Not on file      ROS:  General: Negative for anorexia, weight loss, fever, chills, fatigue, weakness. Eyes: Negative for vision changes.  ENT: Negative for hoarseness, difficulty swallowing , nasal congestion. CV: Negative  for chest pain, angina, palpitations, dyspnea on exertion, peripheral edema.  Respiratory: Negative for dyspnea at rest, dyspnea on exertion, cough, sputum, wheezing.  GI: See history of present illness. GU:  Negative for dysuria, hematuria, urinary incontinence, urinary frequency, nocturnal urination.  MS: Negative for low back pain.  Chronic ankle pain/mobility issues status post fractures. Derm: Negative for rash or itching.  Neuro: Negative for weakness, abnormal sensation, seizure, frequent headaches, memory loss, confusion.  Psych: Negative for anxiety, depression, suicidal ideation, hallucinations.  Endo: Negative for unusual weight change.  Heme: Negative for bruising or bleeding. Allergy: Negative for rash or hives.    Physical Examination:  BP (!) 166/87    Pulse 62    Temp (!) 97.3 F (36.3 C) (Temporal)    Ht 5' 3.5" (1.613 m)    Wt (!) 321 lb 12.8 oz (146 kg)    BMI 56.11 kg/m    General: Well-nourished, well-developed in no acute distress.  Ambulates with cane. Head: Normocephalic, atraumatic.   Eyes: Conjunctiva pink, no icterus. Mouth: masked. Neck: Supple without thyromegaly, masses, or lymphadenopathy.  Lungs: Clear to auscultation bilaterally.  Heart: Regular rate and rhythm, no murmurs rubs or gallops.  Abdomen: Bowel sounds are normal, nontender, nondistended, no hepatosplenomegaly or masses, no abdominal bruits or hernia , no rebound or guarding.  Exam limited due to body habitus. Rectal: not performed Extremities: No lower extremity edema. No clubbing or deformities.  Neuro: Alert and oriented x 4 , grossly normal neurologically.  Skin: Warm and dry, no rash or jaundice.   Psych: Alert and cooperative, normal mood and affect.  Assessment:  56 year old female with morbid obesity, family history of colon cancer and adenomatous colon polyps as outlined above presenting for first-ever high rescreening colonoscopy.  Denies bowel concerns.  Plan: Colonoscopy  with Dr. Abbey Chatters.  ASA 3.  I have discussed the risks, alternatives, benefits with regards to but not limited to the risk of reaction to medication, bleeding, infection, perforation and the patient is agreeable to proceed. Written consent to be obtained.

## 2021-05-05 NOTE — Telephone Encounter (Signed)
Per [employer] website "The following solutions for the service date entered do not require Pre-Authorization by [employer]. Please note that benefit limits, if applicable, will still be applied. Contact the health plan using the number on the back of the member's ID card if you have any questions regarding coverage or Pre-Authorization requirements."

## 2021-05-05 NOTE — Progress Notes (Signed)
Primary Care Physician:  Valentino Nose, FNP  Primary Gastroenterologist:  Elon Alas. Abbey Chatters, DO   Chief Complaint  Patient presents with   Colonoscopy    Never had tcs. Father, great grandfather, and aunt had colon cancer. Not having any problems.    HPI:  Molly Hamilton is a 56 y.o. female here at the request of Ulyses Southward, FNP to schedule colonoscopy.  Patient has never had a colonoscopy.  Her family history is significant for colon cancer and colon polyps.  Her father, paternal great grandfather, paternal aunt all had colon cancer.  Her aunt was less than 24 when diagnosed.  Her brother has had adenomatous colon polyps, first found in his 80s.  Patient denies any issues with constipation or diarrhea.  No melena or rectal bleeding.  She has occasional heartburn/regurgitation that occurs at night sometimes.  She takes over-the-counter antacids with relief.  Denies dysphagia, vomiting.  She has occasional gas pain but no significant abdominal pain otherwise.   Current Outpatient Medications  Medication Sig Dispense Refill   atorvastatin (LIPITOR) 10 MG tablet Take 10 mg by mouth daily.     dorzolamide-timolol (COSOPT) 22.3-6.8 MG/ML ophthalmic solution 2 (two) times daily.     latanoprost (XALATAN) 0.005 % ophthalmic solution Place 1 drop into both eyes at bedtime.     levothyroxine (SYNTHROID) 75 MCG tablet Take 75 mcg by mouth daily before breakfast.     PARoxetine (PAXIL) 20 MG tablet Take 1 tablet by mouth daily. Can takes 2nd dose if needed.     No current facility-administered medications for this visit.    Allergies as of 05/05/2021 - Review Complete 05/05/2021  Allergen Reaction Noted   Codeine Nausea And Vomiting 05/07/2013   Sulfa antibiotics Nausea And Vomiting 05/07/2013    Past Medical History:  Diagnosis Date   Glaucoma    History of kidney stones    Hyperlipidemia    Hypothyroidism    Morbid obesity with BMI of 50.0-59.9, adult [facility])     Past Surgical  History:  Procedure Laterality Date   ABDOMINAL HYSTERECTOMY     CESAREAN SECTION     x2   CHOLECYSTECTOMY N/A 05/10/2013   Procedure: LAPAROSCOPIC CHOLECYSTECTOMY;  Surgeon: Jamesetta So, MD;  Location: [facility];  Service: General;  Laterality: N/A;   CYSTOSCOPY     with stone extraction   SLT LASER APPLICATION Left 0000000   Procedure: SLT LASER APPLICATION;  Surgeon: Williams Che, MD;  Location: [facility];  Service: Ophthalmology;  Laterality: Left;   TUBAL LIGATION      Family History  Problem Relation Age of Onset   Colon cancer Father 3       advanced when found.   Colon polyps Brother        61s   Colon cancer Paternal Aunt 70       lived 50 years but died from disease   Colon cancer Other        great grandfather on father's side had colon cancer    Social History   Socioeconomic History   Marital status: Married    Spouse name: Not on file   Number of children: Not on file   Years of education: Not on file   Highest education level: Not on file  Occupational History   Not on file  Tobacco Use   Smoking status: Former    Packs/day: 0.25    Years: 4.00    Pack years: 1.00    Types:  Cigarettes    Quit date: 05/07/1981    Years since quitting: 40.0   Smokeless tobacco: Never  Substance and Sexual Activity   Alcohol use: No   Drug use: No   Sexual activity: Yes    Birth control/protection: Surgical  Other Topics Concern   Not on file  Social History Narrative   Not on file   Social Determinants of Health   Financial Resource Strain: Not on file  Food Insecurity: Not on file  Transportation Needs: Not on file  Physical Activity: Not on file  Stress: Not on file  Social Connections: Not on file  Intimate Partner Violence: Not on file      ROS:  General: Negative for anorexia, weight loss, fever, chills, fatigue, weakness. Eyes: Negative for vision changes.  ENT: Negative for hoarseness, difficulty swallowing , nasal congestion. CV: Negative  for chest pain, angina, palpitations, dyspnea on exertion, peripheral edema.  Respiratory: Negative for dyspnea at rest, dyspnea on exertion, cough, sputum, wheezing.  GI: See history of present illness. GU:  Negative for dysuria, hematuria, urinary incontinence, urinary frequency, nocturnal urination.  MS: Negative for low back pain.  Chronic ankle pain/mobility issues status post fractures. Derm: Negative for rash or itching.  Neuro: Negative for weakness, abnormal sensation, seizure, frequent headaches, memory loss, confusion.  Psych: Negative for anxiety, depression, suicidal ideation, hallucinations.  Endo: Negative for unusual weight change.  Heme: Negative for bruising or bleeding. Allergy: Negative for rash or hives.    Physical Examination:  BP (!) 166/87    Pulse 62    Temp (!) 97.3 F (36.3 C) (Temporal)    Ht 5' 3.5" (1.613 m)    Wt (!) 321 lb 12.8 oz (146 kg)    BMI 56.11 kg/m    General: Well-nourished, well-developed in no acute distress.  Ambulates with cane. Head: Normocephalic, atraumatic.   Eyes: Conjunctiva pink, no icterus. Mouth: masked. Neck: Supple without thyromegaly, masses, or lymphadenopathy.  Lungs: Clear to auscultation bilaterally.  Heart: Regular rate and rhythm, no murmurs rubs or gallops.  Abdomen: Bowel sounds are normal, nontender, nondistended, no hepatosplenomegaly or masses, no abdominal bruits or hernia , no rebound or guarding.  Exam limited due to body habitus. Rectal: not performed Extremities: No lower extremity edema. No clubbing or deformities.  Neuro: Alert and oriented x 4 , grossly normal neurologically.  Skin: Warm and dry, no rash or jaundice.   Psych: Alert and cooperative, normal mood and affect.  Assessment:  56 year old female with morbid obesity, family history of colon cancer and adenomatous colon polyps as outlined above presenting for first-ever high rescreening colonoscopy.  Denies bowel concerns.  Plan: Colonoscopy  with Dr. Abbey Chatters.  ASA 3.  I have discussed the risks, alternatives, benefits with regards to but not limited to the risk of reaction to medication, bleeding, infection, perforation and the patient is agreeable to proceed. Written consent to be obtained.

## 2021-05-14 NOTE — Patient Instructions (Signed)
? ? ? ? ? Molly Hamilton ? 05/14/2021  ?  ? @PREFPERIOPPHARMACY @ ? ? Your procedure is scheduled on  05/21/2021. ? ? [facility] to [facility] at  0830  A.M. ? ? Call this number if you have problems the morning of surgery: ? [phone] ? ? Remember: ? Follow the diet and prep instructions given to you by the office. ?  ? Take these medicines the morning of surgery with A SIP OF WATER  ? ?              xyzal, levothyroxine, mobic(if needed), paxil. ?  ? ? ? Do not wear jewelry, make-up or nail polish. ? Do not wear lotions, powders, or perfumes, or deodorant. ? Do not shave 48 hours prior to surgery.  Men may shave face and neck. ? Do not bring valuables to the hospital. ? [facility] is not responsible for any belongings or valuables. ? ?Contacts, dentures or bridgework may not be worn into surgery.  Leave your suitcase in the car.  After surgery it may be brought to your room. ? ?For patients admitted to the hospital, discharge time will be determined by your treatment team. ? ?Patients discharged the day of surgery will not be allowed to drive home and must have someone with them for 24 hours.  ? ? ?Special instructions:   DO NOT smoke tobacco or vape for 24 hours before your procedure. ? ?Please read over the following fact sheets that you were given. ?Anesthesia Post-op Instructions and Care and Recovery After Surgery ?  ? ? ? Colonoscopy, Adult, Care After ?This sheet gives you information about how to care for yourself after your procedure. Your health care provider may also give you more specific instructions. If you have problems or questions, contact your health care provider. ?What can I expect after the procedure? ?After the procedure, it is common to have: ?A small amount of blood in your stool for 24 hours after the procedure. ?Some gas. ?Mild cramping or bloating of your abdomen. ?Follow these instructions at home: ?Eating and drinking ? ?Drink enough fluid to keep your urine pale yellow. ?Follow instructions  from your health care provider about eating or drinking restrictions. ?Resume your normal diet as instructed by your health care provider. Avoid heavy or fried foods that are hard to digest. ?Activity ?Rest as told by your health care provider. ?Avoid sitting for a long time without moving. Get up to take short walks every 1-2 hours. This is important to improve blood flow and breathing. Ask for help if you feel weak or unsteady. ?Return to your normal activities as told by your health care provider. Ask your health care provider what activities are safe for you. ?Managing cramping and bloating ? ?Try walking around when you have cramps or feel bloated. ?Apply heat to your abdomen as told by your health care provider. Use the heat source that your health care provider recommends, such as a moist heat pack or a heating pad. ?Place a towel between your skin and the heat source. ?Leave the heat on for 20-30 minutes. ?Remove the heat if your skin turns bright red. This is especially important if you are unable to feel pain, heat, or cold. You may have a greater risk of getting burned. ?General instructions ?If you were given a sedative during the procedure, it can affect you for several hours. Do not drive or operate machinery until your health care provider says that it is safe. ?For  the first 24 hours after the procedure: ?Do not sign important documents. ?Do not drink alcohol. ?Do your regular daily activities at a slower pace than normal. ?Eat soft foods that are easy to digest. ?Take over-the-counter and prescription medicines only as told by your health care provider. ?Keep all follow-up visits as told by your health care provider. This is important. ?Contact a health care provider if: ?You have blood in your stool 2-3 days after the procedure. ?Get help right away if you have: ?More than a small spotting of blood in your stool. ?Large blood clots in your stool. ?Swelling of your abdomen. ?Nausea or vomiting. ?A  fever. ?Increasing pain in your abdomen that is not relieved with medicine. ?Summary ?After the procedure, it is common to have a small amount of blood in your stool. You may also have mild cramping and bloating of your abdomen. ?If you were given a sedative during the procedure, it can affect you for several hours. Do not drive or operate machinery until your health care provider says that it is safe. ?Get help right away if you have a lot of blood in your stool, nausea or vomiting, a fever, or increased pain in your abdomen. ?This information is not intended to replace advice given to you by your health care provider. Make sure you discuss any questions you have with your health care provider. ?Document Revised: 01/04/2019 Document Reviewed: 09/24/2018 ?Elsevier Patient Education ? 2022 [employer] ?Monitored Anesthesia Care, Care After ?This sheet gives you information about how to care for yourself after your procedure. Your health care provider may also give you more specific instructions. If you have problems or questions, contact your health care provider. ?What can I expect after the procedure? ?After the procedure, it is common to have: ?Tiredness. ?Forgetfulness about what happened after the procedure. ?Impaired judgment for important decisions. ?Nausea or vomiting. ?Some difficulty with balance. ?Follow these instructions at home: ?For the time period you were told by your health care provider: ?  ?Rest as needed. ?Do not participate in activities where you could fall or become injured. ?Do not drive or use machinery. ?Do not drink alcohol. ?Do not take sleeping pills or medicines that cause drowsiness. ?Do not make important decisions or sign legal documents. ?Do not take care of children on your own. ?Eating and drinking ?Follow the diet that is recommended by your health care provider. ?Drink enough fluid to keep your urine pale yellow. ?If you vomit: ?Drink water, juice, or soup when you can drink  without vomiting. ?Make sure you have little or no nausea before eating solid foods. ?General instructions ?Have a responsible adult stay with you for the time you are told. It is important to have someone help care for you until you are awake and alert. ?Take over-the-counter and prescription medicines only as told by your health care provider. ?If you have sleep apnea, surgery and certain medicines can increase your risk for breathing problems. Follow instructions from your health care provider about wearing your sleep device: ?Anytime you are sleeping, including during daytime naps. ?While taking prescription pain medicines, sleeping medicines, or medicines that make you drowsy. ?Avoid smoking. ?Keep all follow-up visits as told by your health care provider. This is important. ?Contact a health care provider if: ?You keep feeling nauseous or you keep vomiting. ?You feel light-headed. ?You are still sleepy or having trouble with balance after 24 hours. ?You develop a rash. ?You have a fever. ?You have redness or swelling around  the IV site. ?Get help right away if: ?You have trouble breathing. ?You have new-onset confusion at home. ?Summary ?For several hours after your procedure, you may feel tired. You may also be forgetful and have poor judgment. ?Have a responsible adult stay with you for the time you are told. It is important to have someone help care for you until you are awake and alert. ?Rest as told. Do not drive or operate machinery. Do not drink alcohol or take sleeping pills. ?Get help right away if you have trouble breathing, or if you suddenly become confused. ?This information is not intended to replace advice given to you by your health care provider. Make sure you discuss any questions you have with your health care provider. ?Document Revised: 11/14/2019 Document Reviewed: 01/31/2019 ?Elsevier Patient Education ? 2022 Elsevier Inc. ? ?

## 2021-05-19 ENCOUNTER — Encounter (HOSPITAL_COMMUNITY)
Admission: RE | Admit: 2021-05-19 | Discharge: 2021-05-19 | Disposition: A | Discharge status: Home / Self Care | Payer: BC Managed Care – PPO | Source: Ambulatory Visit | Attending: Internal Medicine | Admitting: Internal Medicine

## 2021-05-21 ENCOUNTER — Encounter (HOSPITAL_COMMUNITY)
Admission: RE | Disposition: A | Discharge status: Home / Self Care | Payer: Self-pay | Source: Home / Self Care | Attending: Internal Medicine

## 2021-05-21 ENCOUNTER — Ambulatory Visit (HOSPITAL_COMMUNITY): Payer: BC Managed Care – PPO | Admitting: Anesthesiology

## 2021-05-21 ENCOUNTER — Ambulatory Visit (HOSPITAL_COMMUNITY)
Admission: RE | Admit: 2021-05-21 | Discharge: 2021-05-21 | Disposition: A | Discharge status: Home / Self Care | Payer: BC Managed Care – PPO | Attending: Internal Medicine | Admitting: Internal Medicine

## 2021-05-21 ENCOUNTER — Encounter (HOSPITAL_COMMUNITY): Payer: Self-pay

## 2021-05-21 ENCOUNTER — Other Ambulatory Visit: Payer: Self-pay

## 2021-05-21 DIAGNOSIS — Z87891 Personal history of nicotine dependence: Secondary | ICD-10-CM | POA: Diagnosis not present

## 2021-05-21 DIAGNOSIS — Z6841 Body Mass Index (BMI) 40.0 and over, adult: Secondary | ICD-10-CM | POA: Insufficient documentation

## 2021-05-21 DIAGNOSIS — K648 Other hemorrhoids: Secondary | ICD-10-CM | POA: Diagnosis not present

## 2021-05-21 DIAGNOSIS — E039 Hypothyroidism, unspecified: Secondary | ICD-10-CM | POA: Insufficient documentation

## 2021-05-21 DIAGNOSIS — Z800 Family history of malignant neoplasm of digestive organs: Secondary | ICD-10-CM | POA: Diagnosis not present

## 2021-05-21 DIAGNOSIS — Z1211 Encounter for screening for malignant neoplasm of colon: Secondary | ICD-10-CM | POA: Diagnosis not present

## 2021-05-21 DIAGNOSIS — Z8371 Family history of colonic polyps: Secondary | ICD-10-CM | POA: Diagnosis not present

## 2021-05-21 SURGERY — COLONOSCOPY WITH PROPOFOL
Anesthesia: General

## 2021-05-21 MED ORDER — SIMETHICONE 40 MG/0.6ML PO SUSP
ORAL | Status: DC | PRN
  Administered 2021-05-21: 60 mL

## 2021-05-21 MED ORDER — PROPOFOL 10 MG/ML IV BOLUS
INTRAVENOUS | Status: DC | PRN
Start: 2021-05-21 — End: 2021-05-21
  Administered 2021-05-21: 100 mg via INTRAVENOUS

## 2021-05-21 MED ORDER — LACTATED RINGERS IV SOLN: INTRAVENOUS | Status: DC | PRN

## 2021-05-21 MED ORDER — LIDOCAINE HCL (CARDIAC) PF 100 MG/5ML IV SOSY
PREFILLED_SYRINGE | INTRAVENOUS | Status: DC | PRN
  Administered 2021-05-21: 50 mg via INTRAVENOUS

## 2021-05-21 MED ORDER — PROPOFOL 500 MG/50ML IV EMUL
INTRAVENOUS | Status: DC | PRN
  Administered 2021-05-21: 125 ug/kg/min via INTRAVENOUS

## 2021-05-21 NOTE — Op Note (Signed)
[facility] ?Patient Name: Molly Hamilton ?Procedure Date: 05/21/2021 9:46 AM ?MRN: [id number] ?Date of Birth: 02/03/1966 ?Attending MD: Elon Alas. Abbey Chatters , DO ?CSN: [id number] ?Age: 56 ?Admit Type: Outpatient ?Procedure:                Colonoscopy ?Indications:              Colon cancer screening in patient at increased  ?                          risk: Colorectal cancer in father ?Providers:                Elon Alas. Abbey Chatters, DO, Summerfield Page, Raphael Gibney,  ?                          Technician ?Referring MD:              ?Medicines:                See the Anesthesia note for documentation of the  ?                          administered medications ?Complications:            No immediate complications. ?Estimated Blood Loss:     Estimated blood loss: none. ?Procedure:                Pre-Anesthesia Assessment: ?                          - The anesthesia plan was to use monitored  ?                          anesthesia care (MAC). ?                          After obtaining informed consent, the colonoscope  ?                          was passed under direct vision. Throughout the  ?                          procedure, the patient's blood pressure, pulse, and  ?                          oxygen saturations were monitored continuously. The  ?                          [id number]-HQ190L ([id number]) scope was introduced through  ?                          the anus and advanced to the the cecum, identified  ?                          by appendiceal orifice and ileocecal valve. The  ?                          colonoscopy was performed without difficulty. The  ?  patient tolerated the procedure well. The quality  ?                          of the bowel preparation was evaluated using the  ?                          BBPS [facility] Bowel Preparation Scale) with scores  ?                          of: Right Colon = 3, Transverse Colon = 3 and Left  ?                          Colon = 3 (entire mucosa seen well with no  residual  ?                          staining, small fragments of stool or opaque  ?                          liquid). The total BBPS score equals 9. ?Scope In: 9:57:38 AM ?Scope Out: 10:10:17 AM ?Scope Withdrawal Time: 0 hours 10 minutes 21 seconds  ?Total Procedure Duration: 0 hours 12 minutes 39 seconds  ?Findings: ?     The perianal and digital rectal examinations were normal. ?     Non-bleeding internal hemorrhoids were found during endoscopy. ?     The exam was otherwise without abnormality. ?Impression:               - Non-bleeding internal hemorrhoids. ?                          - The examination was otherwise normal. ?                          - No specimens collected. ?Moderate Sedation: ?     Per Anesthesia Care ?Recommendation:           - Patient has a contact number available for  ?                          emergencies. The signs and symptoms of potential  ?                          delayed complications were discussed with the  ?                          patient. Return to normal activities tomorrow.  ?                          Written discharge instructions were provided to the  ?                          patient. ?                          - Resume previous diet. ?                          -  Continue present medications. ?                          - Await pathology results. ?                          - Repeat colonoscopy in 5 years for high risk  ?                          screening purposes/familuy history of colon cancer ?                          - Return to GI clinic PRN. ?Procedure Code(s):        --- Professional --- ?                          G0105, Colorectal cancer screening; colonoscopy on  ?                          individual at high risk ?Diagnosis Code(s):        --- Professional --- ?                          Z80.0, Family history of malignant neoplasm of  ?                          digestive organs ?                          K64.8, Other hemorrhoids ?CPT copyright 2019 American  Medical Association. All rights reserved. ?The codes documented in this report are preliminary and upon coder review may  ?be revised to meet current compliance requirements. ?Elon Alas. Abbey Chatters, DO ?Elon Alas. Manuel Garcia, DO ?05/21/2021 10:13:07 AM ?This report has been signed electronically. ?Number of Addenda: 0 ?

## 2021-05-21 NOTE — Transfer of Care (Signed)
Immediate Anesthesia Transfer of Care Note ? ?Patient: Molly Hamilton ? ?Procedure(s) Performed: COLONOSCOPY WITH PROPOFOL ? ?Patient Location: Short Stay ? ?Anesthesia Type:General ? ?Level of Consciousness: awake, alert  and oriented ? ?Airway & Oxygen Therapy: Patient Spontanous Breathing ? ?Post-op Assessment: Report given to RN and Post -op Vital signs reviewed and stable ? ?Post vital signs: Reviewed and stable ? ?Last Vitals:  ?Vitals Value Taken Time  ?BP 119/77 05/21/21 1015  ?Temp 36.4 ?C 05/21/21 1015  ?Pulse 69 05/21/21 1015  ?Resp 14 05/21/21 1015  ?SpO2 100 % 05/21/21 1015  ? ? ?Last Pain:  ?Vitals:  ? 05/21/21 1015  ?TempSrc: Oral  ?PainSc: 0-No pain  ?   ? ?Patients Stated Pain Goal: 8 (05/21/21 0845) ? ?Complications: No notable events documented. ?

## 2021-05-21 NOTE — Interval H&P Note (Signed)
History and Physical Interval Note: ? ?05/21/2021 ?9:19 AM ? ?[name]  has presented today for surgery, with the diagnosis of screening TCS, FH CRC.  The various methods of treatment have been discussed with the patient and family. After consideration of risks, benefits and other options for treatment, the patient has consented to  Procedure(s) with comments: ?COLONOSCOPY WITH PROPOFOL (N/A) - 10:30am as a surgical intervention.  The patient's history has been reviewed, patient examined, no change in status, stable for surgery.  I have reviewed the patient's chart and labs.  Questions were answered to the patient's satisfaction.   ? ? ?Molly Hamilton ? ? ?

## 2021-05-21 NOTE — Discharge Instructions (Addendum)
?  Colonoscopy ?Discharge Instructions ? ?Read the instructions outlined below and refer to this sheet in the next few weeks. These discharge instructions provide you with general information on caring for yourself after you leave the hospital. Your doctor may also give you specific instructions. While your treatment has been planned according to the most current medical practices available, unavoidable complications occasionally occur.  ? ?ACTIVITY ?You may resume your regular activity, but move at a slower pace for the next 24 hours.  ?Take frequent rest periods for the next 24 hours.  ?Walking will help get rid of the air and reduce the bloated feeling in your belly (abdomen).  ?No driving for 24 hours (because of the medicine (anesthesia) used during the test).   ?Do not sign any important legal documents or operate any machinery for 24 hours (because of the anesthesia used during the test).  ?NUTRITION ?Drink plenty of fluids.  ?You may resume your normal diet as instructed by your doctor.  ?Begin with a light meal and progress to your normal diet. Heavy or fried foods are harder to digest and may make you feel sick to your stomach (nauseated).  ?Avoid alcoholic beverages for 24 hours or as instructed.  ?MEDICATIONS ?You may resume your normal medications unless your doctor tells you otherwise.  ?WHAT YOU CAN EXPECT TODAY ?Some feelings of bloating in the abdomen.  ?Passage of more gas than usual.  ?Spotting of blood in your stool or on the toilet paper.  ?IF YOU HAD POLYPS REMOVED DURING THE COLONOSCOPY: ?No aspirin products for 7 days or as instructed.  ?No alcohol for 7 days or as instructed.  ?Eat a soft diet for the next 24 hours.  ?FINDING OUT THE RESULTS OF YOUR TEST ?Not all test results are available during your visit. If your test results are not back during the visit, make an appointment with your caregiver to find out the results. Do not assume everything is normal if you have not heard from your  caregiver or the medical facility. It is important for you to follow up on all of your test results.  ?SEEK IMMEDIATE MEDICAL ATTENTION IF: ?You have more than a spotting of blood in your stool.  ?Your belly is swollen (abdominal distention).  ?You are nauseated or vomiting.  ?You have a temperature over 101.  ?You have abdominal pain or discomfort that is severe or gets worse throughout the day.  ? ?Your colonoscopy was relatively unremarkable.  I did not find any polyps or evidence of colon cancer.  I recommend repeating colonoscopy in 5 years for colon cancer screening purposes given your family history.   Otherwise follow-up with GI as needed. ? ? ?I hope you have a great rest of your week! ? ?Hennie Duos. Marletta Lor, D.O. ?Gastroenterology and Hepatology ?[facility] ? ?

## 2021-05-21 NOTE — Anesthesia Preprocedure Evaluation (Signed)
Anesthesia Evaluation  ?Patient identified by MRN, date of birth, ID band ?Patient awake ? ? ? ?Reviewed: ?Allergy & Precautions, H&P , NPO status , Patient's Chart, lab work & pertinent test results, reviewed documented beta blocker date and time  ? ?Airway ?Mallampati: II ? ?TM Distance: >3 FB ?Neck ROM: full ? ? ? Dental ?no notable dental hx. ? ?  ?Pulmonary ?neg pulmonary ROS, former smoker,  ?  ?Pulmonary exam normal ?breath sounds clear to auscultation ? ? ? ? ? ? Cardiovascular ?Exercise Tolerance: Good ?negative cardio ROS ? ? ?Rhythm:regular Rate:Normal ? ? ?  ?Neuro/Psych ?negative neurological ROS ? negative psych ROS  ? GI/Hepatic ?negative GI ROS, Neg liver ROS,   ?Endo/Other  ?Hypothyroidism Morbid obesity ? Renal/GU ?negative Renal ROS  ?negative genitourinary ?  ?Musculoskeletal ? ? Abdominal ?  ?Peds ? Hematology ?negative hematology ROS ?(+)   ?Anesthesia Other Findings ? ? Reproductive/Obstetrics ?negative OB ROS ? ?  ? ? ? ? ? ? ? ? ? ? ? ? ? ?  ?  ? ? ? ? ? ? ? ? ?Anesthesia Physical ?Anesthesia Plan ? ?ASA: 3 ? ?Anesthesia Plan: General  ? ?Post-op Pain Management:   ? ?Induction:  ? ?PONV Risk Score and Plan:  ? ?Airway Management Planned:  ? ?Additional Equipment:  ? ?Intra-op Plan:  ? ?Post-operative Plan:  ? ?Informed Consent: I have reviewed the patients History and Physical, chart, labs and discussed the procedure including the risks, benefits and alternatives for the proposed anesthesia with the patient or authorized representative who has indicated his/her understanding and acceptance.  ? ? ? ?Dental Advisory Given ? ?Plan Discussed with: CRNA ? ?Anesthesia Plan Comments:   ? ? ? ? ? ? ?Anesthesia Quick Evaluation ? ?

## 2021-05-22 NOTE — Anesthesia Postprocedure Evaluation (Signed)
Anesthesia Post Note ? ?Patient: TEONA VARGUS ? ?Procedure(s) Performed: COLONOSCOPY WITH PROPOFOL ? ?Patient location during evaluation: Phase II ?Anesthesia Type: General ?Level of consciousness: awake ?Pain management: pain level controlled ?Vital Signs Assessment: post-procedure vital signs reviewed and stable ?Respiratory status: spontaneous breathing and respiratory function stable ?Cardiovascular status: blood pressure returned to baseline and stable ?Postop Assessment: no headache and no apparent nausea or vomiting ?Anesthetic complications: no ?Comments: Late entry ? ? ?No notable events documented. ? ? ?Last Vitals:  ?Vitals:  ? 05/21/21 0845 05/21/21 1015  ?BP: 139/65 119/77  ?Pulse: 69 69  ?Resp: 14 14  ?Temp: (!) 36.4 ?C 36.4 ?C  ?SpO2: 100% 100%  ?  ?Last Pain:  ?Vitals:  ? 05/21/21 1015  ?TempSrc: Oral  ?PainSc: 0-No pain  ? ? ?  ?  ?  ?  ?  ?  ? ?Windell Norfolk ? ? ? ? ?

## 2021-05-26 ENCOUNTER — Encounter (HOSPITAL_COMMUNITY): Payer: Self-pay | Admitting: Internal Medicine

## 2021-08-12 IMAGING — MG MM DIGITAL SCREENING BILAT W/ TOMO AND CAD
6 of 10 series · 6 of 30 positions shown · non-contrast
Comparison: Previous exam(s).

CLINICAL DATA: Screening.

EXAM:
DIGITAL SCREENING BILATERAL MAMMOGRAM WITH TOMOSYNTHESIS AND CAD
TECHNIQUE: Bilateral screening digital craniocaudal and mediolateral oblique
mammograms were obtained. Bilateral screening digital breast
tomosynthesis was performed. The images were evaluated with
computer-aided detection.

[L MLO synth-2D]
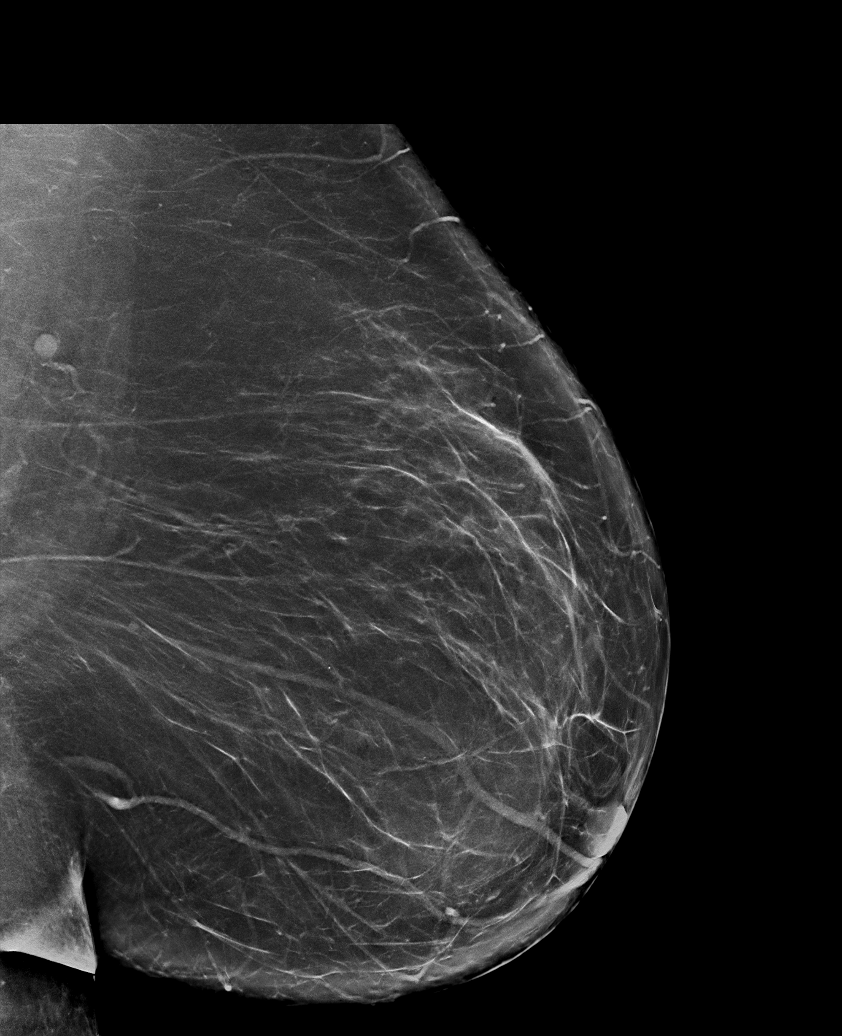

[L CC synth-2D]
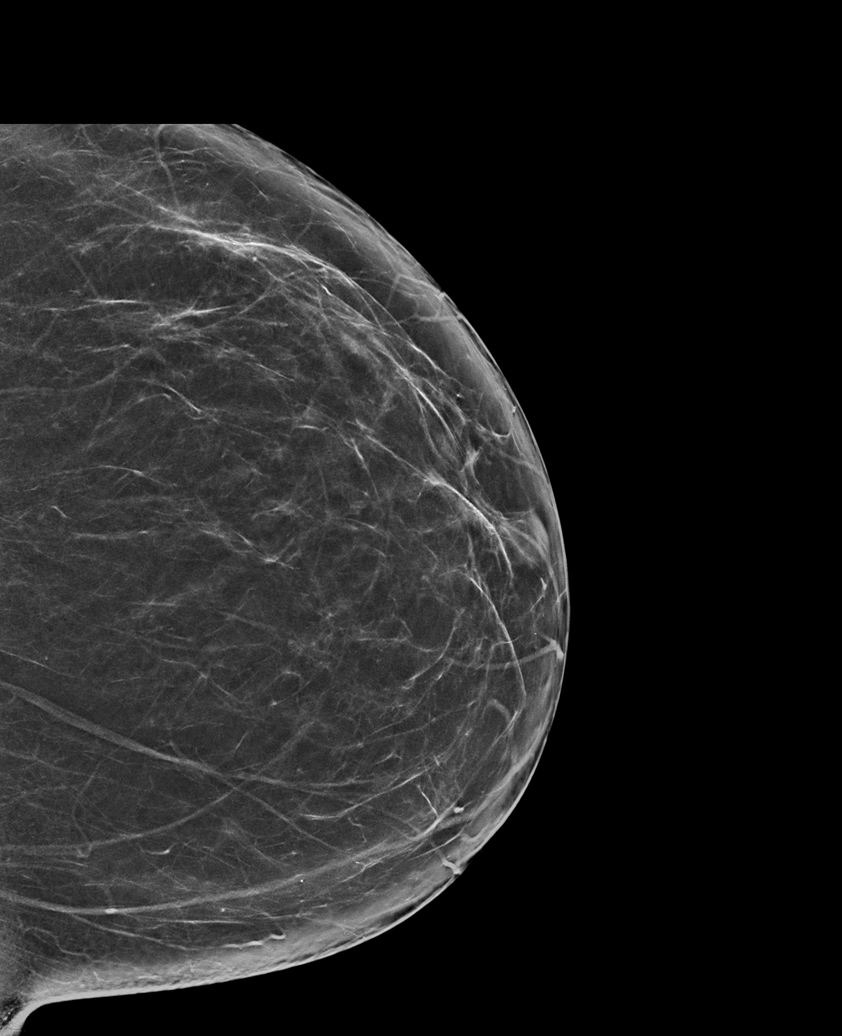

[R MLO synth-2D (1 of 2)]
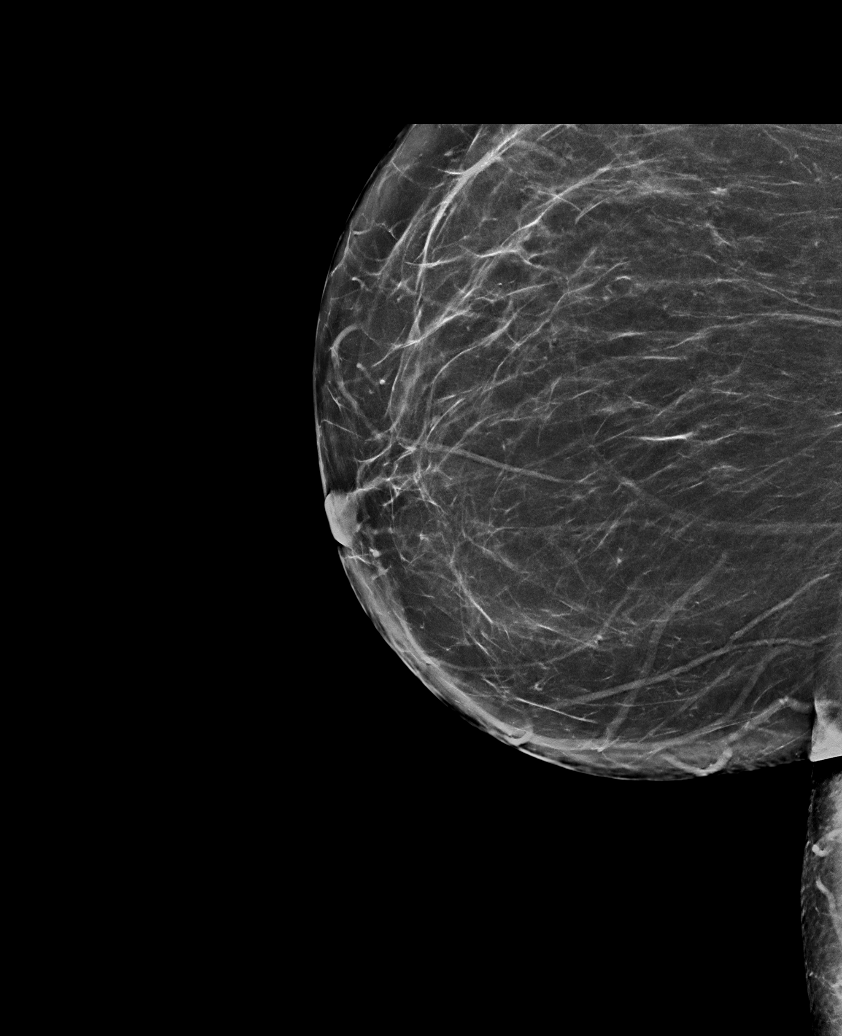

[R CC synth-2D]
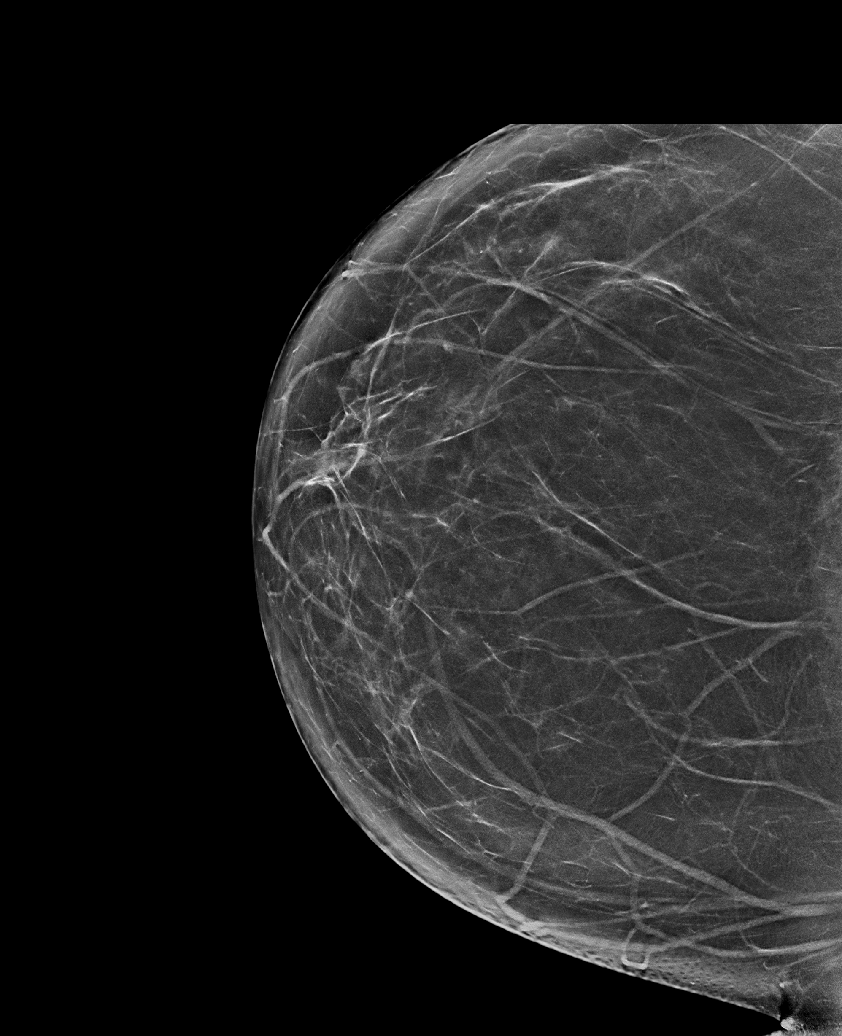

[R MLO synth-2D (2 of 2)]
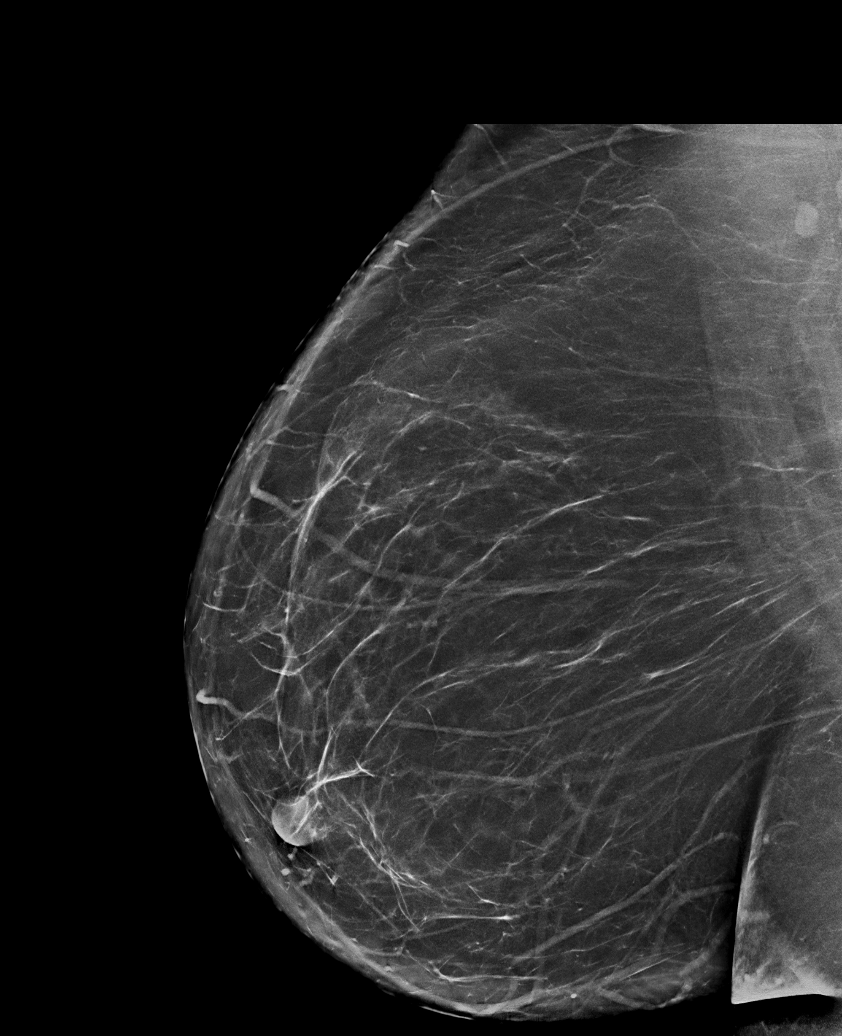

[L MLO tomo · tomo slice 45/90.0]
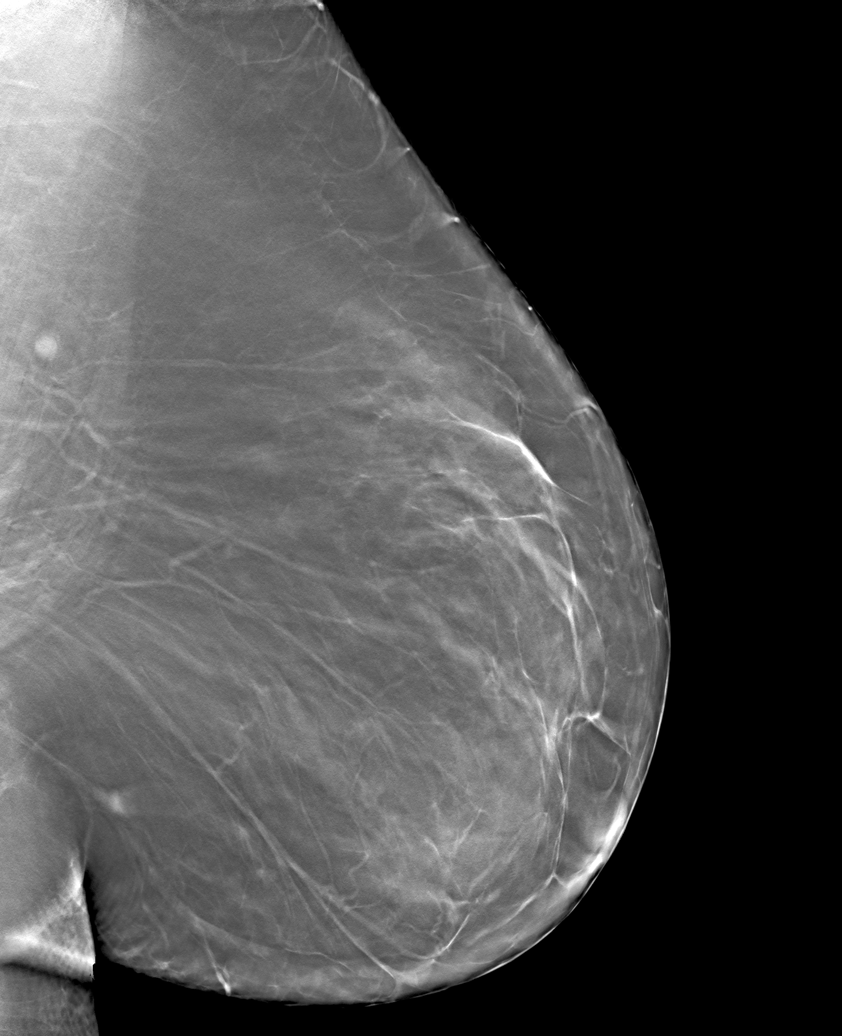

[6 of 30 positions shown; findings below may reference images not displayed]

ACR Breast Density Category b: There are scattered areas of
fibroglandular density.
FINDINGS: There are no findings suspicious for malignancy.
IMPRESSION: No mammographic evidence of malignancy. A result letter of this
screening mammogram will be mailed directly to the patient.

RECOMMENDATION:
Screening mammogram in one year. (Code:51-O-LD2)

BI-RADS CATEGORY  1: Negative.

## 2021-08-19 DIAGNOSIS — E559 Vitamin D deficiency, unspecified: Secondary | ICD-10-CM | POA: Diagnosis not present

## 2021-08-19 DIAGNOSIS — E785 Hyperlipidemia, unspecified: Secondary | ICD-10-CM | POA: Diagnosis not present

## 2021-08-19 DIAGNOSIS — E039 Hypothyroidism, unspecified: Secondary | ICD-10-CM | POA: Diagnosis not present

## 2021-09-01 DIAGNOSIS — H401130 Primary open-angle glaucoma, bilateral, stage unspecified: Secondary | ICD-10-CM | POA: Diagnosis not present

## 2021-09-01 DIAGNOSIS — E559 Vitamin D deficiency, unspecified: Secondary | ICD-10-CM | POA: Diagnosis not present

## 2021-09-01 DIAGNOSIS — E782 Mixed hyperlipidemia: Secondary | ICD-10-CM | POA: Diagnosis not present

## 2021-09-01 DIAGNOSIS — E039 Hypothyroidism, unspecified: Secondary | ICD-10-CM | POA: Diagnosis not present

## 2021-09-07 ENCOUNTER — Encounter: Payer: Self-pay | Admitting: Cardiology

## 2021-09-07 NOTE — Progress Notes (Deleted)
Cardiology Office Note  Date: 09/07/2021   ID: Molly, Hamilton [birth year], MRN [id number]  PCP:  Leone Payor, FNP  Cardiologist:  None Electrophysiologist:  None   No chief complaint on file.   History of Present Illness: Molly Hamilton is a 56 y.o. female referred for cardiology consultation by Ms. Jimmye Norman NP for evaluation of chest pain.  Past Medical History:  Diagnosis Date   Anxiety    Glaucoma    History of kidney stones    Hyperlipidemia    Hypothyroidism    Osteoarthritis    Seasonal allergies    Vitamin D deficiency     Past Surgical History:  Procedure Laterality Date   ABDOMINAL HYSTERECTOMY     CESAREAN SECTION     x2   CHOLECYSTECTOMY N/A 05/10/2013   Procedure: LAPAROSCOPIC CHOLECYSTECTOMY;  Surgeon: Dalia Heading, MD;  Location: [facility];  Service: General;  Laterality: N/A;   COLONOSCOPY WITH PROPOFOL N/A 05/21/2021   Procedure: COLONOSCOPY WITH PROPOFOL;  Surgeon: Lanelle Bal, DO;  [facility];  Service: Endoscopy;  Laterality: N/A;  10:30am   CYSTOSCOPY     with stone extraction   SLT LASER APPLICATION Left 09/14/2015   Procedure: SLT LASER APPLICATION;  Surgeon: Susa Simmonds, MD;  Location: [facility];  Service: Ophthalmology;  Laterality: Left;   TUBAL LIGATION      Current Outpatient Medications  Medication Sig Dispense Refill   atorvastatin (LIPITOR) 10 MG tablet Take 10 mg by mouth daily.     Cholecalciferol (VITAMIN D3 PO) Take 1 tablet by mouth daily.     CRANBERRY PO Take 1 tablet by mouth daily.     dorzolamide-timolol (COSOPT) 22.3-6.8 MG/ML ophthalmic solution Place 1 drop into both eyes 2 (two) times daily.     latanoprost (XALATAN) 0.005 % ophthalmic solution Place 1 drop into both eyes at bedtime.     levocetirizine (XYZAL) 5 MG tablet Take 5 mg by mouth daily as needed (Sinus).     levothyroxine (SYNTHROID) 75 MCG tablet Take 75 mcg by mouth daily before breakfast.     meloxicam (MOBIC) 15 MG tablet Take 15 mg by  mouth daily.     Multiple Vitamins-Minerals (MULTIVITAMIN WITH MINERALS) tablet Take 1 tablet by mouth daily.     Omega-3 Fatty Acids (FISH OIL PO) Take 1 tablet by mouth daily.     PARoxetine (PAXIL) 20 MG tablet Take 20 mg by mouth daily. Can takes 2nd dose if needed.     No current facility-administered medications for this visit.   Allergies:  Codeine and Sulfa antibiotics   Social History: The patient  reports that she quit smoking about 40 years ago. Her smoking use included cigarettes. She has a 1.00 pack-year smoking history. She has never used smokeless tobacco. She reports that she does not drink alcohol and does not use drugs.   Family History: The patient's family history includes Colon cancer in an other family member; Colon cancer (age of onset: 59) in her paternal aunt; Colon cancer (age of onset: 59) in her father; Colon polyps in her brother.   ROS:  Please see the history of present illness. Otherwise, complete review of systems is positive for {NONE DEFAULTED:18576}.  All other systems are reviewed and negative.   Physical Exam: VS:  There were no vitals taken for this visit., BMI There is no height or weight on file to calculate BMI.  Wt Readings from Last 3 Encounters:  05/21/21 Marland Kitchen)  320 lb (145.2 kg)  05/19/21 (!) 320 lb (145.2 kg)  05/05/21 (!) 321 lb 12.8 oz (146 kg)    General: Patient appears comfortable at rest. HEENT: Conjunctiva and lids normal, oropharynx clear with moist mucosa. Neck: Supple, no elevated JVP or carotid bruits, no thyromegaly. Lungs: Clear to auscultation, nonlabored breathing at rest. Cardiac: Regular rate and rhythm, no S3 or significant systolic murmur, no pericardial rub. Abdomen: Soft, nontender, no hepatomegaly, bowel sounds present, no guarding or rebound. Extremities: No pitting edema, distal pulses 2+. Skin: Warm and dry. Musculoskeletal: No kyphosis. Neuropsychiatric: Alert and oriented x3, affect grossly appropriate.  ECG:    No tracings available for review.  Recent Labwork:  June 2023: TSH 3.81, hemoglobin 12.9, platelets 243, BUN 20, creatinine 0.8, potassium 4.6, AST 52, ALT 49, cholesterol 134, triglycerides 51, HDL 60, LDL 63  Other Studies Reviewed Today:  No prior cardiac testing for review today.  Assessment and Plan:    Medication Adjustments/Labs and Tests Ordered: Current medicines are reviewed at length with the patient today.  Concerns regarding medicines are outlined above.   Tests Ordered: No orders of the defined types were placed in this encounter.   Medication Changes: No orders of the defined types were placed in this encounter.   Disposition:  Follow up {follow up:15908}  Signed, Jonelle Sidle, MD, [facility] 09/07/2021 9:29 AM    [facility] at [facility] [address] [place], [place], [place] [address] Phone: [phone]; Fax: [phone]

## 2021-09-08 ENCOUNTER — Ambulatory Visit: Payer: BC Managed Care – PPO | Admitting: Cardiology

## 2021-09-17 DIAGNOSIS — H401123 Primary open-angle glaucoma, left eye, severe stage: Secondary | ICD-10-CM | POA: Diagnosis not present

## 2021-10-18 NOTE — Progress Notes (Unsigned)
Cardiology Office Note  Date: 10/19/2021   ID: Jullie, Arps [birth year], MRN [id number]  PCP:  Leone Payor, FNP  Cardiologist:  Nona Dell, MD Electrophysiologist:  None   Chief Complaint  Patient presents with   History of chest pain    History of Present Illness: Molly Hamilton is a 56 y.o. female referred for cardiology consultation by Ms. Jimmye Norman NP for the evaluation of chest discomfort.  She reports having episodes of upper thoracic tightness a few months ago, occurred both at rest and with some activity.  She is limited by chronic ankle pain following previous fracture and uses a cane to ambulate.  More recently the symptoms have not been as much of an issue but she has had a sense of palpitations, describes a rapid irregular heartbeat at least a few times a month.  She has a family history of cardiovascular disease, her father in his 56s and her brother in his 53s.  She has not had any personal history of hypertension, does take Lipitor for hyperlipidemia and had recent lab work showing good LDL control at 63.  She is not a diabetic.  I personally reviewed her ECG today which shows sinus bradycardia.  She has not undergone any prior cardiac structural or ischemic testing.  Past Medical History:  Diagnosis Date   Anxiety    Glaucoma    History of kidney stones    Hyperlipidemia    Hypothyroidism    Osteoarthritis    Seasonal allergies    Vitamin D deficiency     Past Surgical History:  Procedure Laterality Date   ABDOMINAL HYSTERECTOMY     CESAREAN SECTION     x2   CHOLECYSTECTOMY N/A 05/10/2013   Procedure: LAPAROSCOPIC CHOLECYSTECTOMY;  Surgeon: Dalia Heading, MD;  Location: [facility];  Service: General;  Laterality: N/A;   COLONOSCOPY WITH PROPOFOL N/A 05/21/2021   Procedure: COLONOSCOPY WITH PROPOFOL;  Surgeon: Lanelle Bal, DO;  [facility];  Service: Endoscopy;  Laterality: N/A;  10:30am   CYSTOSCOPY     with stone extraction   SLT LASER  APPLICATION Left 09/14/2015   Procedure: SLT LASER APPLICATION;  Surgeon: Susa Simmonds, MD;  Location: [facility];  Service: Ophthalmology;  Laterality: Left;   TUBAL LIGATION      Current Outpatient Medications  Medication Sig Dispense Refill   atorvastatin (LIPITOR) 10 MG tablet Take 10 mg by mouth daily.     Cholecalciferol (VITAMIN D3 PO) Take 1 tablet by mouth daily.     CRANBERRY PO Take 1 tablet by mouth daily.     dorzolamide-timolol (COSOPT) 22.3-6.8 MG/ML ophthalmic solution Place 1 drop into both eyes 2 (two) times daily.     latanoprost (XALATAN) 0.005 % ophthalmic solution Place 1 drop into both eyes at bedtime.     levocetirizine (XYZAL) 5 MG tablet Take 5 mg by mouth daily as needed (Sinus).     levothyroxine (SYNTHROID) 75 MCG tablet Take 75 mcg by mouth daily before breakfast.     meloxicam (MOBIC) 15 MG tablet Take 15 mg by mouth daily.     Multiple Vitamins-Minerals (MULTIVITAMIN WITH MINERALS) tablet Take 1 tablet by mouth daily.     Omega-3 Fatty Acids (FISH OIL PO) Take 1 tablet by mouth daily.     PARoxetine (PAXIL) 20 MG tablet Take 20 mg by mouth daily. Can takes 2nd dose if needed.     No current facility-administered medications for this visit.  Allergies:  Codeine and Sulfa antibiotics   Social History: The patient  reports that she quit smoking about 40 years ago. Her smoking use included cigarettes. She has a 1.00 pack-year smoking history. She has never used smokeless tobacco. She reports that she does not drink alcohol and does not use drugs.   Family History: The patient's family history includes Colon cancer in an other family member; Colon cancer (age of onset: 85) in her paternal aunt; Colon cancer (age of onset: 45) in her father; Colon polyps in her brother.   ROS: No orthopnea or PND.  Physical Exam: VS:  BP 116/72   Pulse 61   Ht 5' 3.5" (1.613 m)   Wt (!) 320 lb (145.2 kg)   SpO2 95%   BMI 55.80 kg/m , BMI Body mass index is 55.8  kg/m.  Wt Readings from Last 3 Encounters:  10/19/21 (!) 320 lb (145.2 kg)  05/21/21 (!) 320 lb (145.2 kg)  05/19/21 (!) 320 lb (145.2 kg)    General: Patient appears comfortable at rest. HEENT: Conjunctiva and lids normal, oropharynx clear. Neck: Supple, no elevated JVP or carotid bruits, no thyromegaly. Lungs: Clear to auscultation, nonlabored breathing at rest. Cardiac: Regular rate and rhythm, no S3 or significant systolic murmur, no pericardial rub. Abdomen: Soft, bowel sounds present, no guarding or rebound. Extremities: No pitting edema, distal pulses 2+. Skin: Warm and dry. Musculoskeletal: No kyphosis. Neuropsychiatric: Alert and oriented x3, affect grossly appropriate.  ECG:   No prior tracing available for review today.  Recent Labwork:  June 2023: Cholesterol 134, triglycerides 61, HDL 60, LDL 63, BUN 20, creatinine 0.8, potassium 4.6, AST 52, ALT 49, hemoglobin 12.9, platelets 243, TSH 3.81  Other Studies Reviewed Today:  No prior cardiac testing for review today.  Assessment and Plan:  1.  History of precordial pain in a 56 year old woman with a BMI 55, hyperlipidemia, and family history of cardiovascular disease.  ECG shows sinus bradycardia.  She has not undergone any prior cardiac structural or ischemic testing.  Will arrange an echocardiogram and also a 2-day protocol Lexiscan Myoview (unable to ambulate on the treadmill with use of cane).  2.  Intermittent palpitations, she is in sinus rhythm today.  We will plan a 7-day Zio patch mainly to exclude atrial fibrillation.  She has no history of cardiac arrhythmia documented.  3.  Mixed hyperlipidemia, on Lipitor with recent LDL 63.  Medication Adjustments/Labs and Tests Ordered: Current medicines are reviewed at length with the patient today.  Concerns regarding medicines are outlined above.   Tests Ordered: Orders Placed This Encounter  Procedures   NM Myocar Multi W/Spect W/Wall Motion / EF   EKG 12-Lead    ECHOCARDIOGRAM COMPLETE    Medication Changes: No orders of the defined types were placed in this encounter.   Disposition:  Follow up  test results.  Signed, Jonelle Sidle, MD, [facility] 10/19/2021 9:29 AM    [facility] at [facility] [address] [place], [place], [place] [address] Phone: [phone]; Fax: [phone]

## 2021-10-19 ENCOUNTER — Telehealth: Payer: Self-pay | Admitting: Cardiology

## 2021-10-19 ENCOUNTER — Ambulatory Visit: Payer: BC Managed Care – PPO | Admitting: Cardiology

## 2021-10-19 ENCOUNTER — Ambulatory Visit (INDEPENDENT_AMBULATORY_CARE_PROVIDER_SITE_OTHER): Payer: BC Managed Care – PPO

## 2021-10-19 ENCOUNTER — Encounter: Payer: Self-pay | Admitting: Cardiology

## 2021-10-19 ENCOUNTER — Other Ambulatory Visit: Payer: Self-pay | Admitting: Cardiology

## 2021-10-19 VITALS — BP 116/72 | HR 61 | Ht 63.5 in | Wt 320.0 lb

## 2021-10-19 DIAGNOSIS — Z87898 Personal history of other specified conditions: Secondary | ICD-10-CM

## 2021-10-19 DIAGNOSIS — R072 Precordial pain: Secondary | ICD-10-CM | POA: Diagnosis not present

## 2021-10-19 DIAGNOSIS — Z8249 Family history of ischemic heart disease and other diseases of the circulatory system: Secondary | ICD-10-CM | POA: Diagnosis not present

## 2021-10-19 DIAGNOSIS — R002 Palpitations: Secondary | ICD-10-CM

## 2021-10-19 DIAGNOSIS — R0789 Other chest pain: Secondary | ICD-10-CM

## 2021-10-19 DIAGNOSIS — E782 Mixed hyperlipidemia: Secondary | ICD-10-CM

## 2021-10-19 DIAGNOSIS — R079 Chest pain, unspecified: Secondary | ICD-10-CM

## 2021-10-19 NOTE — Telephone Encounter (Signed)
Checking percert on the following patient for testing scheduled at [facility].      Lexiscan (2 day protocol) dx: chest pain 7 day zio dx: palpitations  10-28-2021

## 2021-10-19 NOTE — Patient Instructions (Addendum)
Medication Instructions:  Your physician recommends that you continue on your current medications as directed. Please refer to the Current Medication list given to you today.   Labwork: none  Testing/Procedures: Your physician has requested that you have a lexiscan myoview. For further information please visit [email or web address] Please follow instruction sheet, as given.   Your physician has requested that you have an echocardiogram. Echocardiography is a painless test that uses sound waves to create images of your heart. It provides your doctor with information about the size and shape of your heart and how well your heart's chambers and valves are working. This procedure takes approximately one hour. There are no restrictions for this procedure.   Follow-Up:  Your physician recommends that you schedule a follow-up appointment in: Follow Up Pending  Any Other Special Instructions Will Be Listed Below (If Applicable).  If you need a refill on your cardiac medications before your next appointment, please call your pharmacy.   ZIO- Long Term Monitor Instructions   Your physician has requested you wear your ZIO patch monitor 7 days.      Do not shower for the first 24 hours.  You may shower after the first 24 hours.   Press button if you feel a symptom. You will hear a small click.  Record Date, Time and Symptom in the Patient Log Book.   When you are ready to remove patch, follow instructions on last 2 pages of Patient Log Book.  Stick patch monitor onto last page of Patient Log Book.   Place Patient Log Book in [place] box.  Use locking tab on box and tape box closed securely.  The [employer] and [employer] has [employer] on it.  Please place in mailbox as soon as possible.  Your physician should have your test results approximately 7 days after the monitor has been mailed back to [facility].   Call [facility] Customer Care at [phone] if you have questions regarding your  ZIO XT patch monitor.  Call them immediately if you see an orange light blinking on your monitor.   If your monitor falls off in less than 4 days contact our Monitor department at [phone].  If your monitor becomes loose or falls off after 4 days call [employer] at [phone] for suggestions on securing your monitor.

## 2021-10-20 ENCOUNTER — Other Ambulatory Visit: Payer: Self-pay | Admitting: Cardiology

## 2021-10-20 DIAGNOSIS — R079 Chest pain, unspecified: Secondary | ICD-10-CM

## 2021-10-27 ENCOUNTER — Ambulatory Visit (INDEPENDENT_AMBULATORY_CARE_PROVIDER_SITE_OTHER): Payer: BC Managed Care – PPO

## 2021-10-27 DIAGNOSIS — R079 Chest pain, unspecified: Secondary | ICD-10-CM | POA: Diagnosis not present

## 2021-10-27 LAB — ECHOCARDIOGRAM COMPLETE
AR max vel: 2.51 cm2
AV Area VTI: 2.52 cm2
AV Area mean vel: 2.33 cm2
AV Mean grad: 3.7 mmHg
AV Peak grad: 6.4 mmHg
Ao pk vel: 1.27 m/s
Area-P 1/2: 3.5 cm2
Calc EF: 64 %
MV M vel: 1.92 m/s
MV Peak grad: 14.7 mmHg
S' Lateral: 3.11 cm
Single Plane A2C EF: 68 %
Single Plane A4C EF: 60.2 %

## 2021-10-28 ENCOUNTER — Encounter (HOSPITAL_COMMUNITY): Payer: BC Managed Care – PPO

## 2021-10-28 ENCOUNTER — Other Ambulatory Visit (HOSPITAL_COMMUNITY): Payer: BC Managed Care – PPO

## 2021-11-02 ENCOUNTER — Encounter (HOSPITAL_COMMUNITY): Payer: BC Managed Care – PPO

## 2021-11-02 DIAGNOSIS — R002 Palpitations: Secondary | ICD-10-CM | POA: Diagnosis not present

## 2021-11-03 ENCOUNTER — Other Ambulatory Visit (HOSPITAL_COMMUNITY): Payer: BC Managed Care – PPO

## 2021-11-03 ENCOUNTER — Encounter (HOSPITAL_COMMUNITY): Payer: BC Managed Care – PPO

## 2021-11-04 ENCOUNTER — Other Ambulatory Visit (HOSPITAL_COMMUNITY): Payer: BC Managed Care – PPO

## 2021-11-08 ENCOUNTER — Telehealth: Payer: Self-pay | Admitting: *Deleted

## 2021-11-08 NOTE — Telephone Encounter (Signed)
Contacted to give results of echo and heart monitor. Encouraged to reschedule stress test and said she couldn't afford it. Said the test would cost her $1380 out of pocket and she could not afford it.

## 2021-11-10 NOTE — Telephone Encounter (Signed)
Patient informed and verbalized understanding of plan. 

## 2021-11-29 ENCOUNTER — Other Ambulatory Visit (HOSPITAL_COMMUNITY): Payer: Self-pay | Admitting: Family Medicine

## 2021-11-29 DIAGNOSIS — Z1231 Encounter for screening mammogram for malignant neoplasm of breast: Secondary | ICD-10-CM

## 2021-12-02 ENCOUNTER — Ambulatory Visit (HOSPITAL_COMMUNITY): Payer: BC Managed Care – PPO

## 2021-12-06 DIAGNOSIS — E559 Vitamin D deficiency, unspecified: Secondary | ICD-10-CM | POA: Diagnosis not present

## 2021-12-06 DIAGNOSIS — E785 Hyperlipidemia, unspecified: Secondary | ICD-10-CM | POA: Diagnosis not present

## 2021-12-06 DIAGNOSIS — E039 Hypothyroidism, unspecified: Secondary | ICD-10-CM | POA: Diagnosis not present

## 2021-12-08 ENCOUNTER — Ambulatory Visit (HOSPITAL_COMMUNITY)
Admission: RE | Admit: 2021-12-08 | Discharge: 2021-12-08 | Disposition: A | Discharge status: Home / Self Care | Payer: BC Managed Care – PPO | Source: Ambulatory Visit | Attending: Family Medicine | Admitting: Family Medicine

## 2021-12-08 DIAGNOSIS — Z1231 Encounter for screening mammogram for malignant neoplasm of breast: Secondary | ICD-10-CM | POA: Insufficient documentation

## 2021-12-22 DIAGNOSIS — E782 Mixed hyperlipidemia: Secondary | ICD-10-CM | POA: Diagnosis not present

## 2021-12-22 DIAGNOSIS — E039 Hypothyroidism, unspecified: Secondary | ICD-10-CM | POA: Diagnosis not present

## 2021-12-22 DIAGNOSIS — R300 Dysuria: Secondary | ICD-10-CM | POA: Diagnosis not present

## 2021-12-22 DIAGNOSIS — F411 Generalized anxiety disorder: Secondary | ICD-10-CM | POA: Diagnosis not present

## 2021-12-22 DIAGNOSIS — Z23 Encounter for immunization: Secondary | ICD-10-CM | POA: Diagnosis not present

## 2021-12-22 DIAGNOSIS — E559 Vitamin D deficiency, unspecified: Secondary | ICD-10-CM | POA: Diagnosis not present

## 2021-12-22 DIAGNOSIS — R809 Proteinuria, unspecified: Secondary | ICD-10-CM | POA: Diagnosis not present

## 2021-12-22 DIAGNOSIS — N390 Urinary tract infection, site not specified: Secondary | ICD-10-CM | POA: Diagnosis not present

## 2022-03-21 DIAGNOSIS — H401123 Primary open-angle glaucoma, left eye, severe stage: Secondary | ICD-10-CM | POA: Diagnosis not present

## 2022-06-24 DIAGNOSIS — E785 Hyperlipidemia, unspecified: Secondary | ICD-10-CM | POA: Diagnosis not present

## 2022-06-24 DIAGNOSIS — Z139 Encounter for screening, unspecified: Secondary | ICD-10-CM | POA: Diagnosis not present

## 2022-06-24 DIAGNOSIS — E559 Vitamin D deficiency, unspecified: Secondary | ICD-10-CM | POA: Diagnosis not present

## 2022-06-24 DIAGNOSIS — E039 Hypothyroidism, unspecified: Secondary | ICD-10-CM | POA: Diagnosis not present

## 2022-06-30 DIAGNOSIS — Z0001 Encounter for general adult medical examination with abnormal findings: Secondary | ICD-10-CM | POA: Diagnosis not present

## 2022-06-30 DIAGNOSIS — E782 Mixed hyperlipidemia: Secondary | ICD-10-CM | POA: Diagnosis not present

## 2022-06-30 DIAGNOSIS — F411 Generalized anxiety disorder: Secondary | ICD-10-CM | POA: Diagnosis not present

## 2022-06-30 DIAGNOSIS — I10 Essential (primary) hypertension: Secondary | ICD-10-CM | POA: Diagnosis not present

## 2022-06-30 DIAGNOSIS — M79671 Pain in right foot: Secondary | ICD-10-CM | POA: Diagnosis not present

## 2022-06-30 DIAGNOSIS — E039 Hypothyroidism, unspecified: Secondary | ICD-10-CM | POA: Diagnosis not present

## 2022-06-30 DIAGNOSIS — M722 Plantar fascial fibromatosis: Secondary | ICD-10-CM | POA: Diagnosis not present

## 2022-06-30 DIAGNOSIS — E559 Vitamin D deficiency, unspecified: Secondary | ICD-10-CM | POA: Diagnosis not present

## 2022-09-19 DIAGNOSIS — H401111 Primary open-angle glaucoma, right eye, mild stage: Secondary | ICD-10-CM | POA: Diagnosis not present

## 2022-10-20 DIAGNOSIS — Z882 Allergy status to sulfonamides status: Secondary | ICD-10-CM | POA: Diagnosis not present

## 2022-10-20 DIAGNOSIS — R609 Edema, unspecified: Secondary | ICD-10-CM | POA: Diagnosis not present

## 2022-10-20 DIAGNOSIS — W19XXXA Unspecified fall, initial encounter: Secondary | ICD-10-CM | POA: Diagnosis not present

## 2022-10-20 DIAGNOSIS — S62647A Nondisplaced fracture of proximal phalanx of left little finger, initial encounter for closed fracture: Secondary | ICD-10-CM | POA: Diagnosis not present

## 2022-10-20 DIAGNOSIS — S63285A Dislocation of proximal interphalangeal joint of left ring finger, initial encounter: Secondary | ICD-10-CM | POA: Diagnosis not present

## 2022-10-20 DIAGNOSIS — S022XXA Fracture of nasal bones, initial encounter for closed fracture: Secondary | ICD-10-CM | POA: Diagnosis not present

## 2022-10-20 DIAGNOSIS — J342 Deviated nasal septum: Secondary | ICD-10-CM | POA: Diagnosis not present

## 2022-10-20 DIAGNOSIS — M79645 Pain in left finger(s): Secondary | ICD-10-CM | POA: Diagnosis not present

## 2022-10-20 DIAGNOSIS — S0121XA Laceration without foreign body of nose, initial encounter: Secondary | ICD-10-CM | POA: Diagnosis not present

## 2022-10-20 DIAGNOSIS — S62625A Displaced fracture of medial phalanx of left ring finger, initial encounter for closed fracture: Secondary | ICD-10-CM | POA: Diagnosis not present

## 2022-10-20 DIAGNOSIS — S62609A Fracture of unspecified phalanx of unspecified finger, initial encounter for closed fracture: Secondary | ICD-10-CM | POA: Diagnosis not present

## 2022-10-20 DIAGNOSIS — Z6841 Body Mass Index (BMI) 40.0 and over, adult: Secondary | ICD-10-CM | POA: Diagnosis not present

## 2022-10-20 DIAGNOSIS — W1839XA Other fall on same level, initial encounter: Secondary | ICD-10-CM | POA: Diagnosis not present

## 2022-10-20 DIAGNOSIS — M85842 Other specified disorders of bone density and structure, left hand: Secondary | ICD-10-CM | POA: Diagnosis not present

## 2022-10-20 DIAGNOSIS — S62615A Displaced fracture of proximal phalanx of left ring finger, initial encounter for closed fracture: Secondary | ICD-10-CM | POA: Diagnosis not present

## 2022-10-20 DIAGNOSIS — S0181XA Laceration without foreign body of other part of head, initial encounter: Secondary | ICD-10-CM | POA: Diagnosis not present

## 2022-10-20 DIAGNOSIS — E669 Obesity, unspecified: Secondary | ICD-10-CM | POA: Diagnosis not present

## 2022-10-20 DIAGNOSIS — S01511A Laceration without foreign body of lip, initial encounter: Secondary | ICD-10-CM | POA: Diagnosis not present

## 2022-10-20 DIAGNOSIS — I10 Essential (primary) hypertension: Secondary | ICD-10-CM | POA: Diagnosis not present

## 2022-10-27 DIAGNOSIS — S62621A Displaced fracture of medial phalanx of left index finger, initial encounter for closed fracture: Secondary | ICD-10-CM | POA: Diagnosis not present

## 2022-10-27 DIAGNOSIS — S022XXD Fracture of nasal bones, subsequent encounter for fracture with routine healing: Secondary | ICD-10-CM | POA: Diagnosis not present

## 2022-10-27 DIAGNOSIS — S62621D Displaced fracture of medial phalanx of left index finger, subsequent encounter for fracture with routine healing: Secondary | ICD-10-CM | POA: Diagnosis not present

## 2022-10-27 DIAGNOSIS — J069 Acute upper respiratory infection, unspecified: Secondary | ICD-10-CM | POA: Diagnosis not present

## 2022-10-27 DIAGNOSIS — S022XXA Fracture of nasal bones, initial encounter for closed fracture: Secondary | ICD-10-CM | POA: Diagnosis not present

## 2022-10-27 DIAGNOSIS — M79642 Pain in left hand: Secondary | ICD-10-CM | POA: Diagnosis not present

## 2022-10-27 DIAGNOSIS — S62619A Displaced fracture of proximal phalanx of unspecified finger, initial encounter for closed fracture: Secondary | ICD-10-CM | POA: Diagnosis not present

## 2022-10-27 DIAGNOSIS — S62629A Displaced fracture of medial phalanx of unspecified finger, initial encounter for closed fracture: Secondary | ICD-10-CM | POA: Diagnosis not present

## 2022-10-27 DIAGNOSIS — S0181XD Laceration without foreign body of other part of head, subsequent encounter: Secondary | ICD-10-CM | POA: Diagnosis not present

## 2022-10-27 DIAGNOSIS — S0181XA Laceration without foreign body of other part of head, initial encounter: Secondary | ICD-10-CM | POA: Diagnosis not present

## 2022-11-10 DIAGNOSIS — M79645 Pain in left finger(s): Secondary | ICD-10-CM | POA: Diagnosis not present

## 2022-11-24 DIAGNOSIS — M79642 Pain in left hand: Secondary | ICD-10-CM | POA: Diagnosis not present

## 2022-12-23 DIAGNOSIS — E039 Hypothyroidism, unspecified: Secondary | ICD-10-CM | POA: Diagnosis not present

## 2022-12-23 DIAGNOSIS — E559 Vitamin D deficiency, unspecified: Secondary | ICD-10-CM | POA: Diagnosis not present

## 2022-12-23 DIAGNOSIS — E782 Mixed hyperlipidemia: Secondary | ICD-10-CM | POA: Diagnosis not present

## 2023-01-09 DIAGNOSIS — M79642 Pain in left hand: Secondary | ICD-10-CM | POA: Diagnosis not present

## 2023-01-20 DIAGNOSIS — E039 Hypothyroidism, unspecified: Secondary | ICD-10-CM | POA: Diagnosis not present

## 2023-01-20 DIAGNOSIS — F411 Generalized anxiety disorder: Secondary | ICD-10-CM | POA: Diagnosis not present

## 2023-01-20 DIAGNOSIS — E559 Vitamin D deficiency, unspecified: Secondary | ICD-10-CM | POA: Diagnosis not present

## 2023-01-20 DIAGNOSIS — E782 Mixed hyperlipidemia: Secondary | ICD-10-CM | POA: Diagnosis not present

## 2023-01-25 DIAGNOSIS — M25442 Effusion, left hand: Secondary | ICD-10-CM | POA: Diagnosis not present

## 2023-01-25 DIAGNOSIS — M79642 Pain in left hand: Secondary | ICD-10-CM | POA: Diagnosis not present

## 2023-01-25 DIAGNOSIS — M25642 Stiffness of left hand, not elsewhere classified: Secondary | ICD-10-CM | POA: Diagnosis not present

## 2023-01-25 DIAGNOSIS — S62625D Displaced fracture of medial phalanx of left ring finger, subsequent encounter for fracture with routine healing: Secondary | ICD-10-CM | POA: Diagnosis not present

## 2023-01-31 DIAGNOSIS — M25442 Effusion, left hand: Secondary | ICD-10-CM | POA: Diagnosis not present

## 2023-01-31 DIAGNOSIS — S62625D Displaced fracture of medial phalanx of left ring finger, subsequent encounter for fracture with routine healing: Secondary | ICD-10-CM | POA: Diagnosis not present

## 2023-01-31 DIAGNOSIS — M79642 Pain in left hand: Secondary | ICD-10-CM | POA: Diagnosis not present

## 2023-01-31 DIAGNOSIS — M25642 Stiffness of left hand, not elsewhere classified: Secondary | ICD-10-CM | POA: Diagnosis not present

## 2023-02-07 DIAGNOSIS — M25442 Effusion, left hand: Secondary | ICD-10-CM | POA: Diagnosis not present

## 2023-02-07 DIAGNOSIS — S62625D Displaced fracture of medial phalanx of left ring finger, subsequent encounter for fracture with routine healing: Secondary | ICD-10-CM | POA: Diagnosis not present

## 2023-02-07 DIAGNOSIS — M79642 Pain in left hand: Secondary | ICD-10-CM | POA: Diagnosis not present

## 2023-02-07 DIAGNOSIS — M25642 Stiffness of left hand, not elsewhere classified: Secondary | ICD-10-CM | POA: Diagnosis not present

## 2023-02-20 DIAGNOSIS — M25642 Stiffness of left hand, not elsewhere classified: Secondary | ICD-10-CM | POA: Diagnosis not present

## 2023-02-20 DIAGNOSIS — M654 Radial styloid tenosynovitis [de Quervain]: Secondary | ICD-10-CM | POA: Diagnosis not present

## 2023-02-20 DIAGNOSIS — M79645 Pain in left finger(s): Secondary | ICD-10-CM | POA: Diagnosis not present

## 2023-03-21 DIAGNOSIS — H6692 Otitis media, unspecified, left ear: Secondary | ICD-10-CM | POA: Diagnosis not present

## 2023-03-21 DIAGNOSIS — R059 Cough, unspecified: Secondary | ICD-10-CM | POA: Diagnosis not present

## 2023-03-21 DIAGNOSIS — H66002 Acute suppurative otitis media without spontaneous rupture of ear drum, left ear: Secondary | ICD-10-CM | POA: Diagnosis not present

## 2023-03-21 DIAGNOSIS — J4521 Mild intermittent asthma with (acute) exacerbation: Secondary | ICD-10-CM | POA: Diagnosis not present

## 2023-03-27 DIAGNOSIS — H401111 Primary open-angle glaucoma, right eye, mild stage: Secondary | ICD-10-CM | POA: Diagnosis not present

## 2023-04-24 DIAGNOSIS — L0232 Furuncle of buttock: Secondary | ICD-10-CM | POA: Diagnosis not present

## 2023-04-28 DIAGNOSIS — Z23 Encounter for immunization: Secondary | ICD-10-CM | POA: Diagnosis not present

## 2023-04-28 DIAGNOSIS — L0232 Furuncle of buttock: Secondary | ICD-10-CM | POA: Diagnosis not present

## 2023-07-14 DIAGNOSIS — E559 Vitamin D deficiency, unspecified: Secondary | ICD-10-CM | POA: Diagnosis not present

## 2023-07-14 DIAGNOSIS — E039 Hypothyroidism, unspecified: Secondary | ICD-10-CM | POA: Diagnosis not present

## 2023-07-14 DIAGNOSIS — E782 Mixed hyperlipidemia: Secondary | ICD-10-CM | POA: Diagnosis not present

## 2023-07-21 ENCOUNTER — Other Ambulatory Visit (HOSPITAL_COMMUNITY): Payer: Self-pay | Admitting: Nurse Practitioner

## 2023-07-21 DIAGNOSIS — B372 Candidiasis of skin and nail: Secondary | ICD-10-CM | POA: Diagnosis not present

## 2023-07-21 DIAGNOSIS — Z1231 Encounter for screening mammogram for malignant neoplasm of breast: Secondary | ICD-10-CM

## 2023-07-21 DIAGNOSIS — Z0001 Encounter for general adult medical examination with abnormal findings: Secondary | ICD-10-CM | POA: Diagnosis not present

## 2023-07-21 DIAGNOSIS — E782 Mixed hyperlipidemia: Secondary | ICD-10-CM | POA: Diagnosis not present

## 2023-07-21 DIAGNOSIS — Z0000 Encounter for general adult medical examination without abnormal findings: Secondary | ICD-10-CM | POA: Diagnosis not present

## 2023-07-21 DIAGNOSIS — E039 Hypothyroidism, unspecified: Secondary | ICD-10-CM | POA: Diagnosis not present

## 2023-07-21 DIAGNOSIS — Z23 Encounter for immunization: Secondary | ICD-10-CM | POA: Diagnosis not present

## 2023-07-21 DIAGNOSIS — I10 Essential (primary) hypertension: Secondary | ICD-10-CM | POA: Diagnosis not present

## 2023-08-02 ENCOUNTER — Ambulatory Visit (HOSPITAL_COMMUNITY)
Admission: RE | Admit: 2023-08-02 | Discharge: 2023-08-02 | Disposition: A | Discharge status: Home / Self Care | Source: Ambulatory Visit | Attending: Nurse Practitioner | Admitting: Nurse Practitioner

## 2023-08-02 DIAGNOSIS — Z1231 Encounter for screening mammogram for malignant neoplasm of breast: Secondary | ICD-10-CM | POA: Diagnosis not present

## 2023-09-26 DIAGNOSIS — H401123 Primary open-angle glaucoma, left eye, severe stage: Secondary | ICD-10-CM | POA: Diagnosis not present

## 2023-10-13 ENCOUNTER — Encounter (HOSPITAL_BASED_OUTPATIENT_CLINIC_OR_DEPARTMENT_OTHER): Payer: Self-pay | Admitting: Internal Medicine

## 2023-10-13 DIAGNOSIS — R0683 Snoring: Secondary | ICD-10-CM

## 2023-10-13 DIAGNOSIS — R5383 Other fatigue: Secondary | ICD-10-CM

## 2023-10-17 NOTE — Procedures (Signed)
 Orders only

## 2024-01-05 ENCOUNTER — Encounter: Payer: Self-pay | Admitting: *Deleted

## 2024-01-05 NOTE — Progress Notes (Signed)
 Molly Hamilton                                          MRN: [id number]   01/05/2024   The [facility] Quality Team Specialist reviewed this patient medical record for the purposes of chart review for care gap closure. The following were reviewed: chart review for care gap closure-controlling blood pressure.    [facility] Quality Team

## 2024-01-16 DIAGNOSIS — E782 Mixed hyperlipidemia: Secondary | ICD-10-CM | POA: Diagnosis not present

## 2024-01-16 DIAGNOSIS — E039 Hypothyroidism, unspecified: Secondary | ICD-10-CM | POA: Diagnosis not present

## 2024-01-22 DIAGNOSIS — E782 Mixed hyperlipidemia: Secondary | ICD-10-CM | POA: Diagnosis not present

## 2024-01-22 DIAGNOSIS — K7689 Other specified diseases of liver: Secondary | ICD-10-CM | POA: Diagnosis not present

## 2024-01-22 DIAGNOSIS — Z23 Encounter for immunization: Secondary | ICD-10-CM | POA: Diagnosis not present

## 2024-01-22 DIAGNOSIS — I10 Essential (primary) hypertension: Secondary | ICD-10-CM | POA: Diagnosis not present

## 2024-01-22 DIAGNOSIS — E559 Vitamin D deficiency, unspecified: Secondary | ICD-10-CM | POA: Diagnosis not present

## 2024-01-22 DIAGNOSIS — E039 Hypothyroidism, unspecified: Secondary | ICD-10-CM | POA: Diagnosis not present
